# Patient Record
Sex: Female | Born: 1965 | Race: Black or African American | Hispanic: No | Marital: Single | Smoking: Current some day smoker
Health system: Southern US, Community
[De-identification: ages and names within clinical notes are randomized; demographics above are authoritative.]

## PROBLEM LIST (undated history)

## (undated) HISTORY — PX: ABDOMINAL HYSTERECTOMY: SHX81

---

## 1997-12-12 ENCOUNTER — Emergency Department (HOSPITAL_COMMUNITY): Admission: EM | Admit: 1997-12-12 | Discharge: 1997-12-12 | Payer: Self-pay

## 1997-12-26 ENCOUNTER — Encounter: Payer: Self-pay | Admitting: *Deleted

## 1997-12-26 ENCOUNTER — Inpatient Hospital Stay (HOSPITAL_COMMUNITY): Admission: AD | Admit: 1997-12-26 | Discharge: 1997-12-26 | Payer: Self-pay | Admitting: *Deleted

## 1999-03-18 ENCOUNTER — Encounter: Payer: Self-pay | Admitting: Emergency Medicine

## 1999-03-18 ENCOUNTER — Emergency Department (HOSPITAL_COMMUNITY): Admission: EM | Admit: 1999-03-18 | Discharge: 1999-03-18 | Payer: Self-pay | Admitting: Emergency Medicine

## 1999-04-14 ENCOUNTER — Inpatient Hospital Stay (HOSPITAL_COMMUNITY): Admission: RE | Admit: 1999-04-14 | Discharge: 1999-04-16 | Payer: Self-pay | Admitting: *Deleted

## 1999-04-14 ENCOUNTER — Encounter (INDEPENDENT_AMBULATORY_CARE_PROVIDER_SITE_OTHER): Payer: Self-pay | Admitting: Specialist

## 1999-04-17 ENCOUNTER — Inpatient Hospital Stay (HOSPITAL_COMMUNITY): Admission: AD | Admit: 1999-04-17 | Discharge: 1999-04-20 | Payer: Self-pay | Admitting: *Deleted

## 2001-08-13 ENCOUNTER — Emergency Department (HOSPITAL_COMMUNITY): Admission: EM | Admit: 2001-08-13 | Discharge: 2001-08-13 | Payer: Self-pay | Admitting: Emergency Medicine

## 2002-07-20 ENCOUNTER — Encounter: Payer: Self-pay | Admitting: Obstetrics

## 2002-07-20 ENCOUNTER — Ambulatory Visit (HOSPITAL_COMMUNITY): Admission: RE | Admit: 2002-07-20 | Discharge: 2002-07-20 | Payer: Self-pay | Admitting: Obstetrics

## 2003-03-22 ENCOUNTER — Emergency Department (HOSPITAL_COMMUNITY): Admission: EM | Admit: 2003-03-22 | Discharge: 2003-03-22 | Payer: Self-pay | Admitting: Family Medicine

## 2006-11-10 ENCOUNTER — Emergency Department (HOSPITAL_COMMUNITY): Admission: EM | Admit: 2006-11-10 | Discharge: 2006-11-10 | Payer: Self-pay | Admitting: Emergency Medicine

## 2007-03-13 ENCOUNTER — Emergency Department (HOSPITAL_COMMUNITY): Admission: EM | Admit: 2007-03-13 | Discharge: 2007-03-13 | Payer: Self-pay | Admitting: Emergency Medicine

## 2010-05-22 NOTE — Op Note (Signed)
[facility]  Patient:    Tabitha Winters, Tabitha Winters                      MRN: [id number] Proc. Date: 04/14/99 Adm. Date:  [id number] Attending:  Deniece Ree                           Operative Report  PREOPERATIVE DIAGNOSIS:       Leiomyomata uteri.  Severe dysmenorrhea and dyspareunia.  Chronic uterine pain.  POSTOPERATIVE DIAGNOSIS:      Leiomyomata uteri.  Severe dysmenorrhea and dyspareunia.  Chronic uterine pain. Plus pending pathology.  OPERATION:                    Total vaginal hysterectomy.  SURGEON:                      Deniece Ree, M.D.  ASSISTANT:                    Kathreen Cosier, M.D.  ANESTHESIA:                   General anesthesia.  ESTIMATED BLOOD LOSS:         100 cc.  CONDITION:                    The patient tolerated the procedure well and returned to the recovery room in satisfactory condition.  DESCRIPTION OF PROCEDURE:     The patient was taken to the operating room and prepped and draped in the usual fashion for a vaginal surgery.  The weighted speculum was placed in the vagina following which the anterior and posterior lips of the cervix were then grasped with Milus Banister.  The cervix was then circumscribed with the use of a knife.  The anterior and posterior vaginal mucosa was pushed forward following which the posterior cul-de-sac was then entered using Mayo scissors.  The weighted Bonanno speculum was then put into place.  The cardinal ligaments were then clamped and ligated using #1 chromic in Heaney stitches.  This was done in a sequential fashion until the uterine vessels were  identified, doubly clamped, and tied using #1 chromic in Heaney stitches.  This was done likewise bilaterally and proceeded in a sequential fashion until the uterus was removed from the vaginal cuff.  At this point, hemostasis was obtained and he sponge, needle, and instrument counts were correct x 2.  The  abdominal peritoneum was then closed in a pursestring type fashion using #1 chromic in a pursestring  stitch.  The vaginal cuff was then closed in a horizontal fashion first with angle stitches bilaterally followed by interrupted stitches utilizing #1 chromic.  A Foley was inserted into the bladder and clear urine was present.  At this point, hemostasis was present and all sponge, needle, and instrument counts were correct. The procedure was then terminated.  The patient tolerated the procedure well and returned to the recovery room in satisfactory condition. DD:  04/14/99 TD:  04/14/99 Job: [id number]

## 2010-05-22 NOTE — Discharge Summary (Signed)
[facility]  Patient:    Tabitha Winters, Tabitha Winters                      MRN: [id number] Adm. Date:  [id number] Disc. Date: [id number] Attending:  Deniece Ree                           Discharge Summary  BRIEF HISTORY:  The patient is a 45 year old female who was admitted for a total vaginal hysterectomy.  The patient had been complaining of severe dysmenorrhea,  dyspareunia, chronic uterine pain for some time.  She was also noted to have a uterus with numerous myomas and approximately 12-13 weeks in size.  The patient was admitted and underwent a total vaginal hysterectomy.  From this procedure she tolerated it very well without any problems.  She did good throughout her postoperative time and was discharged on the second post- operative day.  WOUND CARE:  The patient was instructed on the possible complications and care or all of this type of surgery.  FOLLOWUP:  She was told to return to my in 4 weeks for follow up evaluation or all prior to that time should any problems arise. DD:  05/02/99 TD:  05/06/99 Job: [id number]

## 2010-05-22 NOTE — Discharge Summary (Signed)
[facility]  Patient:    Tabitha Winters, Tabitha Winters                      MRN: [id number] Adm. Date:  [id number] Disc. Date: [id number] Attending:  Deniece Ree                           Discharge Summary  BRIEF HISTORY:  The patient is a 45 year old female who had just been discharged x 1 day from a total vaginal hysterectomy.  At the day of readmission by Dr. Kirtland Bouchard in my absence, the patient had a temperature of 101.0, and at which time the felt the possibility of a cuff cellulitis was present.  IV antibiotics was then  begun on my return.  On April 20, 1999, the patient had been afebrile, no problems and was discharged on that day on antibiotics.  She was instructed on the possible complications, care for this type of admission and surgery.  FOLLOWUP:  She was told to return to my office in 4 weeks for follow up evaluation or to call prior to that time should any problems arise. DD:  05/02/99 TD:  05/06/99 Job: [id number]

## 2010-05-22 NOTE — H&P (Signed)
[facility]  Patient:    Tabitha Winters, Tabitha Winters                      MRN: [id number] Adm. Date:  [id number] Attending:  Deniece Ree                         History and Physical  HISTORY:                      Patient is a 45 year old female who is being admitted for a total vaginal hysterectomy.  Patient came to me because of severe dysmenorrhea, severe dyspareunia, and chronic uterine pain.  She also had been old she has myomas present.  At the time of evaluation patient had a uterus that was approximately 12 weeks size with numerous myomas present and moderate to severe  tenderness on palpation.  Adnexa are benign.  The decision the proceed with a hysterectomy was done with the understanding of the patients of all the possible complications and care.  She understood.  All of her questions were answered.  PAST MEDICAL HISTORY:         Patient has had a bilateral tubal ligation in the  past.  She has also informed me of her religious reasons for not accepting any blood.  PHYSICAL EXAMINATION:  GENERAL:                      Well-developed, well-nourished female in no acute  distress.  HEENT:                        Within normal limits.  NECK:                         Supple.  BREASTS:                      Without masses, tenderness, or discharge.  LUNGS:                        Clear to percussion and auscultation.  HEART:                        Normal sinus rhythm without murmur, rub, or gallop.  ABDOMEN:                      Benign except for the mid lower quadrant which is  moderate tenderness on palpation.  EXTREMITIES:                  Within normal limits.  NEUROLOGIC:                   Within normal limits.  PELVIC:                       External genitalia, BUSB within normal limits. Vagina is clear.  Cervix is firm and nontender.  The uterus is approximately 12  weeks in size with several myomas present.  Moderate tenderness to  palpation.  DIAGNOSES:                    1. Leiomyomata uteri.  2. Severe dysmenorrhea, dyspareunia.                               3. Chronic uterine pain.  PLAN:                         Total vaginal hysterectomy. DD:  04/14/99 TD:  04/14/99 Job: [id number]

## 2010-06-17 ENCOUNTER — Other Ambulatory Visit (HOSPITAL_COMMUNITY): Payer: Self-pay | Admitting: Obstetrics

## 2010-06-17 DIAGNOSIS — Z1231 Encounter for screening mammogram for malignant neoplasm of breast: Secondary | ICD-10-CM

## 2010-06-30 ENCOUNTER — Ambulatory Visit (HOSPITAL_COMMUNITY): Payer: Self-pay

## 2010-07-13 ENCOUNTER — Ambulatory Visit (HOSPITAL_COMMUNITY): Admission: RE | Admit: 2010-07-13 | Payer: Self-pay | Source: Ambulatory Visit

## 2010-07-21 ENCOUNTER — Ambulatory Visit (HOSPITAL_COMMUNITY)
Admission: RE | Admit: 2010-07-21 | Discharge: 2010-07-21 | Disposition: A | Discharge status: Home / Self Care | Payer: Self-pay | Source: Ambulatory Visit | Attending: Obstetrics | Admitting: Obstetrics

## 2010-07-21 DIAGNOSIS — Z1231 Encounter for screening mammogram for malignant neoplasm of breast: Secondary | ICD-10-CM

## 2010-07-24 ENCOUNTER — Other Ambulatory Visit: Payer: Self-pay | Admitting: Obstetrics

## 2010-07-24 DIAGNOSIS — R928 Other abnormal and inconclusive findings on diagnostic imaging of breast: Secondary | ICD-10-CM

## 2010-07-30 ENCOUNTER — Ambulatory Visit
Admission: RE | Admit: 2010-07-30 | Discharge: 2010-07-30 | Disposition: A | Discharge status: Home / Self Care | Payer: Self-pay | Source: Ambulatory Visit | Attending: Obstetrics | Admitting: Obstetrics

## 2010-07-30 DIAGNOSIS — R928 Other abnormal and inconclusive findings on diagnostic imaging of breast: Secondary | ICD-10-CM

## 2011-08-31 ENCOUNTER — Other Ambulatory Visit (HOSPITAL_COMMUNITY): Payer: Self-pay | Admitting: Obstetrics

## 2011-08-31 DIAGNOSIS — Z1231 Encounter for screening mammogram for malignant neoplasm of breast: Secondary | ICD-10-CM

## 2011-09-20 ENCOUNTER — Ambulatory Visit (HOSPITAL_COMMUNITY): Payer: Self-pay

## 2011-09-24 ENCOUNTER — Emergency Department (HOSPITAL_COMMUNITY): Payer: 59

## 2011-09-24 ENCOUNTER — Emergency Department (HOSPITAL_COMMUNITY)
Admission: EM | Admit: 2011-09-24 | Discharge: 2011-09-24 | Disposition: A | Discharge status: Home / Self Care | Payer: 59 | Attending: Emergency Medicine | Admitting: Emergency Medicine

## 2011-09-24 ENCOUNTER — Encounter (HOSPITAL_COMMUNITY): Payer: Self-pay

## 2011-09-24 DIAGNOSIS — J069 Acute upper respiratory infection, unspecified: Secondary | ICD-10-CM | POA: Insufficient documentation

## 2011-09-24 DIAGNOSIS — Z79899 Other long term (current) drug therapy: Secondary | ICD-10-CM | POA: Insufficient documentation

## 2011-09-24 LAB — CBC
HCT: 34.1 % — ABNORMAL LOW (ref 36.0–46.0)
Hemoglobin: 11.3 g/dL — ABNORMAL LOW (ref 12.0–15.0)
MCH: 27.3 pg (ref 26.0–34.0)
MCHC: 33.1 g/dL (ref 30.0–36.0)
MCV: 82.4 fL (ref 78.0–100.0)

## 2011-09-24 LAB — BASIC METABOLIC PANEL
BUN: 9 mg/dL (ref 6–23)
Calcium: 9.3 mg/dL (ref 8.4–10.5)
Creatinine, Ser: 0.89 mg/dL (ref 0.50–1.10)
GFR calc non Af Amer: 78 mL/min — ABNORMAL LOW (ref >90)
Glucose, Bld: 99 mg/dL (ref 70–99)

## 2011-09-24 LAB — URINALYSIS, ROUTINE W REFLEX MICROSCOPIC
Bilirubin Urine: NEGATIVE
Protein, ur: NEGATIVE mg/dL
Urobilinogen, UA: 0.2 mg/dL (ref 0.0–1.0)

## 2011-09-24 LAB — URINE MICROSCOPIC-ADD ON

## 2011-09-24 MED ORDER — HYDROCODONE-HOMATROPINE 5-1.5 MG/5ML PO SYRP: 5.0000 mL | ORAL_SOLUTION | Freq: Four times a day (QID) | ORAL | Status: DC | PRN

## 2011-09-24 NOTE — ED Provider Notes (Signed)
History     CSN: [id number]  Arrival date & time 09/24/11  1556   First MD Initiated Contact with Patient 09/24/11 1638      No chief complaint on file.   (Consider location/radiation/quality/duration/timing/severity/associated sxs/prior treatment) HPI Comments: 46 year old female with no significant past medical history presents emergency department with chief complaint of upper respiratory type symptoms.  Onset of symptoms began yesterday and and include productive cough, nasal congestion, sinus pressure, headache, and dizziness.  Patient reports that she has been taking over-the-counter Mucinex and Tussin cough DM with good relief.  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  Patient denies actually having a syncopal episode, chest pain, shortness of breath, change in vision, ataxia, or disequilibrium.  She also developed fever, night sweats, or chills.  Patient has no other complaints this time.  The history is provided by the patient.    History reviewed. No pertinent past medical history.  Past Surgical History  Procedure Date  . Colon surgery     No family history on file.  History  Substance Use Topics  . Smoking status: Not on file  . Smokeless tobacco: Not on file  . Alcohol Use:      social    OB History    Grav Para Term Preterm Abortions TAB SAB Ect Mult Living                  Review of Systems  Constitutional: Negative for fever, chills, appetite change and fatigue.  HENT: Positive for congestion and rhinorrhea. Negative for hearing loss, ear pain, nosebleeds, sore throat, sneezing, trouble swallowing, neck stiffness, voice change, postnasal drip, sinus pressure, tinnitus and ear discharge.   Eyes: Negative for photophobia and visual disturbance.  Respiratory: Positive for cough and chest tightness.  Negative for apnea, choking, shortness of breath, wheezing and stridor.   Cardiovascular: Negative for chest pain, palpitations and leg swelling.  Gastrointestinal: Negative for nausea, vomiting, abdominal pain, diarrhea and constipation.  Genitourinary: Negative for dysuria, urgency and flank pain.  Musculoskeletal: Negative for myalgias.  Neurological: Negative for dizziness, seizures, syncope, weakness, light-headedness, numbness and headaches.  Psychiatric/Behavioral: Negative for behavioral problems and confusion.  All other systems reviewed and are negative.    Allergies  Review of patient's allergies indicates no known allergies.  Home Medications   Current Outpatient Rx  Name Route Sig Dispense Refill  . ALBUTEROL SULFATE HFA 108 (90 BASE) MCG/ACT IN AERS Inhalation Inhale 2 puffs into the lungs every 6 (six) hours as needed. For cough/wheezing    . DEXTROMETHORPHAN-GUAIFENESIN 10-100 MG/5ML PO SYRP Oral Take 15 mLs by mouth every 12 (twelve) hours. For cough    . FLUTICASONE PROPIONATE 50 MCG/ACT NA SUSP Nasal Place 2 sprays into the nose daily.    . GUAIFENESIN ER 600 MG PO TB12 Oral Take 1,200 mg by mouth 2 (two) times daily.    Marland Kitchen LEVOCETIRIZINE DIHYDROCHLORIDE 5 MG PO TABS Oral Take 5 mg by mouth every evening.    Marland Kitchen MONTELUKAST SODIUM 10 MG PO TABS Oral Take 10 mg by mouth at bedtime.    . OLOPATADINE HCL 0.2 % OP SOLN Ophthalmic Apply 1 drop to eye daily as needed. For allergies      BP 116/79  Pulse 70  Temp 98.3 F (36.8 C) (Oral)  Resp 18  SpO2 100%  Physical  Exam  Constitutional: She is oriented to person, place, and time. She appears well-developed and well-nourished. No distress.  HENT:  Head: Normocephalic and atraumatic.  Right Ear: External ear normal.  Left Ear: External ear normal.  Nose: Rhinorrhea present. No nasal septal hematoma. No epistaxis.  Mouth/Throat: Uvula is midline, oropharynx is clear and moist and mucous membranes are normal. No  oropharyngeal exudate.  Eyes: Conjunctivae normal and EOM are normal. Pupils are equal, round, and reactive to light. No scleral icterus.  Neck: Normal range of motion. Neck supple. No tracheal deviation present. No thyromegaly present.  Cardiovascular: Normal rate, regular rhythm, normal heart sounds and intact distal pulses.   Pulmonary/Chest: Effort normal and breath sounds normal. No stridor. No respiratory distress. She has no wheezes.  Abdominal: Soft.  Musculoskeletal: Normal range of motion. She exhibits no edema and no tenderness.  Neurological: She is alert and oriented to person, place, and time. Coordination normal.  Skin: Skin is warm and dry. No rash noted. She is not diaphoretic. No erythema. No pallor.  Psychiatric: She has a normal mood and affect. Her behavior is normal.    ED Course  Procedures (including critical care time)  Labs Reviewed  CBC - Abnormal; Notable for the following:    WBC 12.0 (*)     Hemoglobin 11.3 (*)     HCT 34.1 (*)     All other components within normal limits  BASIC METABOLIC PANEL - Abnormal; Notable for the following:    Sodium 134 (*)     GFR calc non Af Amer 78 (*)     All other components within normal limits  GLUCOSE, CAPILLARY - Abnormal; Notable for the following:    Glucose-Capillary 107 (*)     All other components within normal limits  URINALYSIS, ROUTINE W REFLEX MICROSCOPIC   Dg Chest 2 View  09/24/2011  *RADIOLOGY REPORT*  Clinical Data: Dizziness.  Chest congestion.  Cough.  Smoker.  CHEST - 2 VIEW  Comparison: Report from 03/18/1999  Findings: Cardiac and mediastinal contours appear normal.  The lungs appear clear.  No pleural effusion is identified.  IMPRESSION:  No significant abnormality identified.   Original Report Authenticated By: Dellia Cloud, M.D.      No diagnosis found.    MDM  URI  Pt CXR negative for acute infiltrate. Patients symptoms are consistent with URI, likely viral etiology. Discussed  that antibiotics are not indicated for viral infections. Pt will be discharged with symptomatic treatment.  Verbalizes understanding and is agreeable with plan. Pt is hemodynamically stable & in NAD prior to dc.         Jaci Carrel, [place] 09/24/11 [phone]

## 2011-09-24 NOTE — ED Notes (Signed)
Patient reports the following: Thurs felt sick felt like chest was going to explored when coughing. Treated self with Mucomyst tablets and tessalon from hospital pharmacy. Noted improvement with coughing now productive from dark green-ow light yellow. This am woke up with a headache, described as a sinus headache/pressure and point specific to left side of forehead rated pain 6-7/10. 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

## 2011-09-24 NOTE — ED Provider Notes (Signed)
Medical screening examination/treatment/procedure(s) were performed by non-physician practitioner and as supervising physician I was immediately available for consultation/collaboration.  Brinden Kincheloe, MD 09/24/11 2326 

## 2011-10-06 ENCOUNTER — Ambulatory Visit (HOSPITAL_COMMUNITY): Payer: 59 | Attending: Obstetrics

## 2011-12-13 ENCOUNTER — Emergency Department (HOSPITAL_COMMUNITY)
Admission: EM | Admit: 2011-12-13 | Discharge: 2011-12-14 | Disposition: A | Discharge status: Home / Self Care | Payer: 59 | Attending: Emergency Medicine | Admitting: Emergency Medicine

## 2011-12-13 ENCOUNTER — Encounter (HOSPITAL_COMMUNITY): Payer: Self-pay | Admitting: *Deleted

## 2011-12-13 DIAGNOSIS — Z90710 Acquired absence of both cervix and uterus: Secondary | ICD-10-CM | POA: Insufficient documentation

## 2011-12-13 DIAGNOSIS — Z79899 Other long term (current) drug therapy: Secondary | ICD-10-CM | POA: Insufficient documentation

## 2011-12-13 DIAGNOSIS — R112 Nausea with vomiting, unspecified: Secondary | ICD-10-CM | POA: Insufficient documentation

## 2011-12-13 DIAGNOSIS — R197 Diarrhea, unspecified: Secondary | ICD-10-CM | POA: Insufficient documentation

## 2011-12-13 DIAGNOSIS — A084 Viral intestinal infection, unspecified: Secondary | ICD-10-CM

## 2011-12-13 DIAGNOSIS — Z9889 Other specified postprocedural states: Secondary | ICD-10-CM | POA: Insufficient documentation

## 2011-12-13 DIAGNOSIS — A088 Other specified intestinal infections: Secondary | ICD-10-CM | POA: Insufficient documentation

## 2011-12-13 DIAGNOSIS — F17200 Nicotine dependence, unspecified, uncomplicated: Secondary | ICD-10-CM | POA: Insufficient documentation

## 2011-12-13 NOTE — ED Notes (Signed)
Pt c/o cold symptoms starting Sunday; took flu shot today and two hours later developed body aches; cramping; diarrhea;

## 2011-12-14 LAB — URINALYSIS, ROUTINE W REFLEX MICROSCOPIC
Ketones, ur: NEGATIVE mg/dL
Nitrite: NEGATIVE
Specific Gravity, Urine: 1.037 — ABNORMAL HIGH (ref 1.005–1.030)
Urobilinogen, UA: 0.2 mg/dL (ref 0.0–1.0)
pH: 5.5 (ref 5.0–8.0)

## 2011-12-14 LAB — URINE MICROSCOPIC-ADD ON

## 2011-12-14 MED ORDER — SODIUM CHLORIDE 0.9 % IV BOLUS (SEPSIS)
1000.0000 mL | Freq: Once | INTRAVENOUS | Status: AC
  Administered 2011-12-14: 1000 mL via INTRAVENOUS

## 2011-12-14 MED ORDER — MORPHINE SULFATE 4 MG/ML IJ SOLN
4.0000 mg | Freq: Once | INTRAMUSCULAR | Status: AC
  Administered 2011-12-14: 4 mg via INTRAVENOUS
  Filled 2011-12-14: qty 1

## 2011-12-14 MED ORDER — PROMETHAZINE HCL 25 MG/ML IJ SOLN
25.0000 mg | Freq: Once | INTRAMUSCULAR | Status: AC
  Administered 2011-12-14: 25 mg via INTRAVENOUS
  Filled 2011-12-14: qty 1

## 2011-12-14 MED ORDER — ONDANSETRON HCL 4 MG/2ML IJ SOLN
4.0000 mg | Freq: Once | INTRAMUSCULAR | Status: AC
  Administered 2011-12-14: 4 mg via INTRAVENOUS
  Filled 2011-12-14: qty 2

## 2011-12-14 MED ORDER — ONDANSETRON HCL 8 MG PO TABS: 8.0000 mg | ORAL_TABLET | Freq: Three times a day (TID) | ORAL | Status: DC | PRN

## 2011-12-14 MED ORDER — ACETAMINOPHEN 325 MG PO TABS
650.0000 mg | ORAL_TABLET | Freq: Once | ORAL | Status: AC
  Administered 2011-12-14: 650 mg via ORAL
  Filled 2011-12-14: qty 2

## 2011-12-14 MED ORDER — HYDROCODONE-ACETAMINOPHEN 5-325 MG PO TABS: 1.0000 | ORAL_TABLET | ORAL | Status: DC | PRN

## 2011-12-14 NOTE — ED Provider Notes (Signed)
History     CSN: [id number]  Arrival date & time 12/13/11  2225   First MD Initiated Contact with Patient 12/14/11 0236      Chief Complaint  Patient presents with  . Generalized Body Aches  . Diarrhea    (Consider location/radiation/quality/duration/timing/severity/associated sxs/prior treatment) HPI History provided by pt.   Pt had body aches and chills 2 days ago.  Yesterday at approx 2pm, she developed lower abdominal cramping, nausea w/ single episode of vomiting, and 10+ episodes of diarrhea.  Unable to tolerate pos.  No known fever and denies hematemesis/hematochezia/melena, urinary and vaginal sx.  Son w/ similar sx 2 days ago.  No recent travel or abx.  Past abd surgeries include hysterectomy.  History reviewed. No pertinent past medical history.  Past Surgical History  Procedure Date  . Colon surgery   . Abdominal hysterectomy     No family history on file.  History  Substance Use Topics  . Smoking status: Current Every Day Smoker -- 0.5 packs/day for 15 years    Types: Cigarettes  . Smokeless tobacco: Not on file  . Alcohol Use: No     Comment: social    OB History    Grav Para Term Preterm Abortions TAB SAB Ect Mult Living                  Review of Systems  All other systems reviewed and are negative.    Allergies  Review of patient's allergies indicates no known allergies.  Home Medications   Current Outpatient Rx  Name  Route  Sig  Dispense  Refill  . LEVOCETIRIZINE DIHYDROCHLORIDE 5 MG PO TABS   Oral   Take 5 mg by mouth every evening.         . OLOPATADINE HCL 0.2 % OP SOLN   Both Eyes   Place 1 drop into both eyes daily as needed. For allergies         . ALBUTEROL SULFATE HFA 108 (90 BASE) MCG/ACT IN AERS   Inhalation   Inhale 2 puffs into the lungs every 6 (six) hours as needed. For cough/wheezing         . FLUTICASONE PROPIONATE 50 MCG/ACT NA SUSP   Nasal   Place 2 sprays into the nose daily as needed. For nasal drainage         . MONTELUKAST SODIUM 10 MG PO TABS   Oral   Take 10 mg by mouth at bedtime.           BP 117/74  Pulse 114  Temp 99.5 F (37.5 C)  Resp 20  SpO2 100%  Physical Exam  Nursing note and vitals reviewed. Constitutional: She is oriented to person, place, and time. She appears well-developed and well-nourished. No distress.  HENT:  Head: Normocephalic and atraumatic.  Mouth/Throat: Oropharynx is clear and moist.  Eyes:       Normal appearance  Neck: Normal range of motion.  Cardiovascular: Normal rate and regular rhythm.        HR 96bmp  Pulmonary/Chest: Effort normal and breath sounds normal. No respiratory distress.  Abdominal: Soft. Bowel sounds are normal. She exhibits no distension and no mass. There is no rebound and no guarding.       Diffuse ttp  Genitourinary:       No CVA tenderness  Musculoskeletal: Normal range of motion.  Neurological: She is alert and oriented to person, place, and time.  Skin: Skin is warm and dry. No  rash noted.  Psychiatric: She has a normal mood and affect. Her behavior is normal.    ED Course  Procedures (including critical care time)  Labs Reviewed  URINALYSIS, ROUTINE W REFLEX MICROSCOPIC - Abnormal; Notable for the following:    APPearance CLOUDY (*)     Specific Gravity, Urine 1.037 (*)     Hgb urine dipstick MODERATE (*)     Bilirubin Urine SMALL (*)     Leukocytes, UA SMALL (*)     All other components within normal limits  URINE MICROSCOPIC-ADD ON   No results found.   1. Viral gastroenteritis       MDM  46yo healthy F presents w/ abdominal cramping, N/V/D x 12hrs.  Afebrile, tachycardic, abd diffusely tender on exam.  Likely has gastroenteritis w/ mild dehydration.  U/A pending.  Will treat w/ 1L IV NS bolus, zofran and morphine and reassess shortly.  3:24 AM   U/A neg.  Temp increased to 100.4 but HR improved.  Pt to receive tylenol.  Cramping resolved but patient continues to be nauseous despite receiving  both zofran and phenergan.  Suspect that it may be related to lack of food/sleep.  Will po challenge.  5:40 AM   Pt tolerating pos and reports feeling better.  Temp improved and VS otherwise stable.  D/c'd home.  Return precautions discussed. 6:11 AM         Otilio Miu, PA-C 12/14/11 [phone]

## 2011-12-14 NOTE — ED Provider Notes (Signed)
Medical screening examination/treatment/procedure(s) were performed by non-physician practitioner and as supervising physician I was immediately available for consultation/collaboration.   Shameek Nyquist M Alonnah Lampkins, MD 12/14/11 0823 

## 2012-02-18 ENCOUNTER — Emergency Department (HOSPITAL_COMMUNITY)
Admission: EM | Admit: 2012-02-18 | Discharge: 2012-02-18 | Disposition: A | Discharge status: Home / Self Care | Payer: 59 | Attending: Emergency Medicine | Admitting: Emergency Medicine

## 2012-02-18 ENCOUNTER — Emergency Department (HOSPITAL_COMMUNITY): Payer: 59

## 2012-02-18 ENCOUNTER — Encounter (HOSPITAL_COMMUNITY): Payer: Self-pay | Admitting: Emergency Medicine

## 2012-02-18 DIAGNOSIS — Z79899 Other long term (current) drug therapy: Secondary | ICD-10-CM | POA: Insufficient documentation

## 2012-02-18 DIAGNOSIS — Y9289 Other specified places as the place of occurrence of the external cause: Secondary | ICD-10-CM | POA: Insufficient documentation

## 2012-02-18 DIAGNOSIS — F17200 Nicotine dependence, unspecified, uncomplicated: Secondary | ICD-10-CM | POA: Insufficient documentation

## 2012-02-18 DIAGNOSIS — Y9389 Activity, other specified: Secondary | ICD-10-CM | POA: Insufficient documentation

## 2012-02-18 DIAGNOSIS — IMO0002 Reserved for concepts with insufficient information to code with codable children: Secondary | ICD-10-CM | POA: Insufficient documentation

## 2012-02-18 DIAGNOSIS — S46912A Strain of unspecified muscle, fascia and tendon at shoulder and upper arm level, left arm, initial encounter: Secondary | ICD-10-CM

## 2012-02-18 DIAGNOSIS — W108XXA Fall (on) (from) other stairs and steps, initial encounter: Secondary | ICD-10-CM | POA: Insufficient documentation

## 2012-02-18 DIAGNOSIS — S139XXA Sprain of joints and ligaments of unspecified parts of neck, initial encounter: Secondary | ICD-10-CM | POA: Insufficient documentation

## 2012-02-18 DIAGNOSIS — R209 Unspecified disturbances of skin sensation: Secondary | ICD-10-CM | POA: Insufficient documentation

## 2012-02-18 DIAGNOSIS — S161XXA Strain of muscle, fascia and tendon at neck level, initial encounter: Secondary | ICD-10-CM

## 2012-02-18 MED ORDER — HYDROCODONE-ACETAMINOPHEN 5-325 MG PO TABS: 1.0000 | ORAL_TABLET | ORAL | Status: DC | PRN

## 2012-02-18 MED ORDER — METHOCARBAMOL 500 MG PO TABS: 500.0000 mg | ORAL_TABLET | Freq: Two times a day (BID) | ORAL | Status: DC

## 2012-02-18 MED ORDER — IBUPROFEN 200 MG PO TABS
600.0000 mg | ORAL_TABLET | Freq: Once | ORAL | Status: AC
  Administered 2012-02-18: 600 mg via ORAL
  Filled 2012-02-18: qty 3

## 2012-02-18 MED ORDER — METHOCARBAMOL 500 MG PO TABS
500.0000 mg | ORAL_TABLET | Freq: Once | ORAL | Status: AC
  Administered 2012-02-18: 500 mg via ORAL
  Filled 2012-02-18: qty 1

## 2012-02-18 MED ORDER — IBUPROFEN 600 MG PO TABS: 600.0000 mg | ORAL_TABLET | Freq: Four times a day (QID) | ORAL | Status: DC | PRN

## 2012-02-18 MED ORDER — HYDROCODONE-ACETAMINOPHEN 5-325 MG PO TABS
1.0000 | ORAL_TABLET | Freq: Once | ORAL | Status: AC
  Administered 2012-02-18: 1 via ORAL
  Filled 2012-02-18: qty 1

## 2012-02-18 NOTE — Progress Notes (Signed)
Pt confirmed during 02/18/12 [facility] visit pcp is bernard marshall epic updated

## 2012-02-18 NOTE — ED Notes (Signed)
Per patient, right shoulder injury r/t fall

## 2012-02-18 NOTE — ED Provider Notes (Signed)
History  This chart was scribed for Tabitha Racer, MD by Bennett Scrape, ED Scribe. This patient was seen in room WTR7/WTR7 and the patient's care was started at 10:42 AM.  CSN: [id number]  Arrival date & time 02/18/12  4098   First MD Initiated Contact with Patient 02/18/12 1042      Chief Complaint  Patient presents with  . Shoulder Pain    Patient is a 47 y.o. female presenting with shoulder pain. The history is provided by the patient. No language interpreter was used.  Shoulder Pain This is a new problem. The current episode started less than 1 hour ago. The problem occurs constantly. The problem has not changed since onset.Pertinent negatives include no chest pain, no abdominal pain and no headaches. The symptoms are relieved by rest. She has tried nothing for the symptoms.    ,Tabitha ROCHEL is a 47 y.o. female who presents to the Emergency Department complaining of sudden onset, non-changing, constant right shoulder pain with associated numbness to the palmar aspect of the right 2nd finger and thumb and intermittent weakness in grip strength after a slip and fall down 5 steps that occurred 30 minutes PTA. Pt states that she fell backwards landing on her back and slipped down the rest of the steps. She reports that the pain is worse with ROM of the right shoulder. She denies head trauma or LOC as associated symptoms. She does not have a h/o chronic medical conditions and is a current everyday smoker and occasional alcohol user.  Pt is right handed.  History reviewed. No pertinent past medical history.  Past Surgical History  Procedure Laterality Date  . Colon surgery    . Abdominal hysterectomy      No family history on file.  History  Substance Use Topics  . Smoking status: Current Every Day Smoker -- 0.50 packs/day for 15 years    Types: Cigarettes  . Smokeless tobacco: Not on file  . Alcohol Use: No     Comment: social    No OB history provided.  Review of  Systems  Cardiovascular: Negative for chest pain.  Gastrointestinal: Negative for abdominal pain.  Musculoskeletal:       Positive for right shoulder pain  Neurological: Negative for headaches.  All other systems reviewed and are negative.    Allergies  Review of patient's allergies indicates no known allergies.  Home Medications   Current Outpatient Rx  Name  Route  Sig  Dispense  Refill  . levocetirizine (XYZAL) 5 MG tablet   Oral   Take 5 mg by mouth every evening.         . montelukast (SINGULAIR) 10 MG tablet   Oral   Take 10 mg by mouth at bedtime.         . Olopatadine HCl (PATADAY OP)   Ophthalmic   Apply 1 drop to eye daily.         Marland Kitchen HYDROcodone-acetaminophen (NORCO/VICODIN) 5-325 MG per tablet   Oral   Take 1 tablet by mouth every 4 (four) hours as needed for pain.   15 tablet   0   . ibuprofen (ADVIL,MOTRIN) 600 MG tablet   Oral   Take 1 tablet (600 mg total) by mouth every 6 (six) hours as needed for pain.   30 tablet   0   . methocarbamol (ROBAXIN) 500 MG tablet   Oral   Take 1 tablet (500 mg total) by mouth 2 (two) times daily.   20  tablet   0     Triage Vitals: BP 129/93  Pulse 72  Temp(Src) 98.6 F (37 C) (Oral)  Resp 18  SpO2 100%  Physical Exam  Nursing note and vitals reviewed. Constitutional: She is oriented to person, place, and time. She appears well-developed and well-nourished. No distress.  HENT:  Head: Normocephalic and atraumatic.  Mouth/Throat: Oropharynx is clear and moist.  No obvious sign of head trauma  Eyes: Conjunctivae and EOM are normal. Pupils are equal, round, and reactive to light.  Neck: Neck supple. No tracheal deviation present.  Right paraspinal cervical tenderness, right trapezius tenderness, right deltoid tenderness, no cervical midline tenderness, no obvious signs of trauma   Cardiovascular: Normal rate.   Pulmonary/Chest: Effort normal. No respiratory distress. She exhibits no tenderness (no  tenderness to the right clavicle).  Musculoskeletal: Normal range of motion.  neurovascularly intact, no right scapula tenderness, no deformities noted, no thoracic or lumbar tenderness  Neurological: She is alert and oriented to person, place, and time.  Equal grip strengths, 5/5 strength in lower extremities   Skin: Skin is warm and dry.  Psychiatric: She has a normal mood and affect. Her behavior is normal.    ED Course  Procedures (including critical care  DIAGNOSTIC STUDIES: Oxygen Saturation is 100% on room air, normal by my interpretation.    COORDINATION OF CARE: 10:52 AM- Advised pt of radiology results. Discussed discharge plan which includes work note for two days and CT scan of neck with pt and pt agreed to plan. Also advised pt to follow up with orthopedist and pt agreed.  12:02 PM-Pt rechecked and c-spine was medically cleared. Upon re-exam, pt has tenderness to palpation of the right paraspinal cervical muscles and right trapezius muscle. Discussed discharge plan which includes f/u with orthopedist and pt agreed to this plan.  Labs Reviewed - No data to display Dg Shoulder Right  02/18/2012  *RADIOLOGY REPORT*  Clinical Data: Larey Seat today with pain  RIGHT SHOULDER - 2+ VIEW  Comparison: None.  Findings: The right humeral head is in normal position and the right glenohumeral joint space appears normal.  The right AC joint is normally aligned.  No acute bony abnormality is seen.  IMPRESSION: Negative.   Original Report Authenticated By: Dwyane Dee, M.D.    Ct Cervical Spine Wo Contrast  02/18/2012  *RADIOLOGY REPORT*  Clinical Data: Acute onset right shoulder pain with numbness in the thumb and second finger.  Status post fall.  CT CERVICAL SPINE WITHOUT CONTRAST  Technique:  Multidetector CT imaging of the cervical spine was performed. Multiplanar CT image reconstructions were also generated.  Comparison: None.  Findings: There is no cervical spine fracture or subluxation.  Mild  disc bulging and endplate spurring are noted at C5-6 and C6-7.  The central canal appears open.  Facet joints are unremarkable. Paraspinous soft tissue structures appear normal.  Lung apices are clear.  IMPRESSION: No acute finding.  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   Original Report Authenticated By: Holley Dexter, M.D.      1. Cervical strain   2. Left shoulder strain       MDM  I personally performed the services described in this documentation, which was scribed in my presence. The recorded information has been reviewed and is accurate.    Tabitha Racer, MD 02/18/12 [phone]

## 2012-06-06 ENCOUNTER — Encounter (HOSPITAL_COMMUNITY): Payer: Self-pay | Admitting: Emergency Medicine

## 2012-06-06 ENCOUNTER — Emergency Department (HOSPITAL_COMMUNITY): Payer: 59

## 2012-06-06 ENCOUNTER — Emergency Department (HOSPITAL_COMMUNITY)
Admission: EM | Admit: 2012-06-06 | Discharge: 2012-06-06 | Disposition: A | Discharge status: Home / Self Care | Payer: 59 | Attending: Emergency Medicine | Admitting: Emergency Medicine

## 2012-06-06 DIAGNOSIS — F17200 Nicotine dependence, unspecified, uncomplicated: Secondary | ICD-10-CM | POA: Insufficient documentation

## 2012-06-06 DIAGNOSIS — Y92009 Unspecified place in unspecified non-institutional (private) residence as the place of occurrence of the external cause: Secondary | ICD-10-CM | POA: Insufficient documentation

## 2012-06-06 DIAGNOSIS — Y9389 Activity, other specified: Secondary | ICD-10-CM | POA: Insufficient documentation

## 2012-06-06 DIAGNOSIS — S60229A Contusion of unspecified hand, initial encounter: Secondary | ICD-10-CM | POA: Insufficient documentation

## 2012-06-06 DIAGNOSIS — W2209XA Striking against other stationary object, initial encounter: Secondary | ICD-10-CM | POA: Insufficient documentation

## 2012-06-06 DIAGNOSIS — S60221A Contusion of right hand, initial encounter: Secondary | ICD-10-CM

## 2012-06-06 DIAGNOSIS — Z79899 Other long term (current) drug therapy: Secondary | ICD-10-CM | POA: Insufficient documentation

## 2012-06-06 MED ORDER — IBUPROFEN 800 MG PO TABS
800.0000 mg | ORAL_TABLET | Freq: Once | ORAL | Status: AC
  Administered 2012-06-06: 800 mg via ORAL
  Filled 2012-06-06 (×2): qty 1

## 2012-06-06 MED ORDER — IBUPROFEN 800 MG PO TABS: 800.0000 mg | ORAL_TABLET | Freq: Three times a day (TID) | ORAL | Status: DC | PRN

## 2012-06-06 NOTE — ED Provider Notes (Signed)
History     CSN: [id number]  Arrival date & time 06/06/12  1229   First MD Initiated Contact with Patient 06/06/12 1312      No chief complaint on file.   (Consider location/radiation/quality/duration/timing/severity/associated sxs/prior treatment) HPI Comments: Patient reports she hit her right hand on the door frame as she was grabbing her puppy who was trying to run out of the door.  Reports throbbing pain and swelling to her right hand, indicates area over 3rd MCP.  Has been putting ice on it with some improvement.  Denies weakness or numbness of hand, denies wrist pain.    The history is provided by the patient.    History reviewed. No pertinent past medical history.  Past Surgical History  Procedure Laterality Date  . Colon surgery    . Abdominal hysterectomy      No family history on file.  History  Substance Use Topics  . Smoking status: Current Every Day Smoker -- 0.50 packs/day for 15 years    Types: Cigarettes  . Smokeless tobacco: Not on file  . Alcohol Use: No     Comment: social    OB History   Grav Para Term Preterm Abortions TAB SAB Ect Mult Living                  Review of Systems  Skin: Negative for color change and wound.  Neurological: Negative for weakness and numbness.    Allergies  Review of patient's allergies indicates no known allergies.  Home Medications   Current Outpatient Rx  Name  Route  Sig  Dispense  Refill  . loratadine (CLARITIN) 10 MG tablet   Oral   Take 10 mg by mouth daily.         . montelukast (SINGULAIR) 10 MG tablet   Oral   Take 10 mg by mouth at bedtime.         . Olopatadine HCl (PATADAY OP)   Ophthalmic   Apply 1 drop to eye daily.           BP 127/76  Pulse 81  Temp(Src) 99.1 F (37.3 C) (Oral)  Resp 16  SpO2 100%  Physical Exam  Nursing note and vitals reviewed. Constitutional: She appears well-developed and well-nourished. No distress.  HENT:  Head: Normocephalic and atraumatic.    Neck: Neck supple.  Pulmonary/Chest: Effort normal.  Musculoskeletal: Normal range of motion. She exhibits edema and tenderness.       Hands: Full AROM of all digits of right hand and right wrist.  Pt is slow to move fingers secondary to pain.  Capillary refill < 2 seconds, sensation intact.   Neurological: She is alert.  Skin: She is not diaphoretic.    ED Course  Procedures (including critical care time)  Labs Reviewed - No data to display Dg Hand Complete Right  06/06/2012   *RADIOLOGY REPORT*  Clinical Data: Injury, pain  RIGHT HAND - COMPLETE 3+ VIEW  Comparison: None.  Findings: Three views of the right hand submitted.  No acute fracture or subluxation.  No radiopaque foreign body.  IMPRESSION: No acute fracture or subluxation.   Original Report Authenticated By: Natasha Mead, M.D.    1. Hand contusion, right, initial encounter     MDM  Pt with contusion of right hand.  No fracture on xray.  Full AROM, neurovascularly intact.  Pt placed in ACE bandage, RICE at home with NSAIDs.  Discussed all results with patient.  Pt given return precautions.  Pt verbalizes understanding and agrees with plan.           Trixie Dredge, PA-C 06/06/12 1436

## 2012-06-06 NOTE — ED Notes (Signed)
Pt trying to grab her dog from going out the door without leash and hit right hand on the door and pt has swelling to right middle knuckle.

## 2012-06-07 NOTE — ED Provider Notes (Signed)
Medical screening examination/treatment/procedure(s) were performed by non-physician practitioner and as supervising physician I was immediately available for consultation/collaboration.  Christopher J. Pollina, MD 06/07/12 0711 

## 2012-10-25 ENCOUNTER — Ambulatory Visit (HOSPITAL_COMMUNITY)
Admission: RE | Admit: 2012-10-25 | Discharge: 2012-10-25 | Disposition: A | Discharge status: Home / Self Care | Payer: 59 | Source: Ambulatory Visit | Attending: Obstetrics | Admitting: Obstetrics

## 2012-10-25 DIAGNOSIS — Z1231 Encounter for screening mammogram for malignant neoplasm of breast: Secondary | ICD-10-CM | POA: Insufficient documentation

## 2012-10-30 ENCOUNTER — Other Ambulatory Visit: Payer: Self-pay | Admitting: Obstetrics

## 2012-10-30 DIAGNOSIS — R928 Other abnormal and inconclusive findings on diagnostic imaging of breast: Secondary | ICD-10-CM

## 2012-11-15 ENCOUNTER — Ambulatory Visit
Admission: RE | Admit: 2012-11-15 | Discharge: 2012-11-15 | Disposition: A | Discharge status: Home / Self Care | Payer: 59 | Source: Ambulatory Visit | Attending: Obstetrics | Admitting: Obstetrics

## 2012-11-15 DIAGNOSIS — R928 Other abnormal and inconclusive findings on diagnostic imaging of breast: Secondary | ICD-10-CM

## 2013-09-19 ENCOUNTER — Other Ambulatory Visit: Payer: Self-pay | Admitting: Family Medicine

## 2013-12-13 ENCOUNTER — Other Ambulatory Visit (HOSPITAL_COMMUNITY): Payer: Self-pay | Admitting: Obstetrics

## 2013-12-13 DIAGNOSIS — Z1231 Encounter for screening mammogram for malignant neoplasm of breast: Secondary | ICD-10-CM

## 2013-12-21 ENCOUNTER — Ambulatory Visit (HOSPITAL_COMMUNITY): Payer: 59 | Attending: Obstetrics

## 2014-01-03 ENCOUNTER — Ambulatory Visit (HOSPITAL_COMMUNITY)
Admission: RE | Admit: 2014-01-03 | Discharge: 2014-01-03 | Disposition: A | Discharge status: Home / Self Care | Payer: 59 | Source: Ambulatory Visit | Attending: Obstetrics | Admitting: Obstetrics

## 2014-01-03 ENCOUNTER — Other Ambulatory Visit: Payer: Self-pay | Admitting: Obstetrics

## 2014-01-03 DIAGNOSIS — Z1231 Encounter for screening mammogram for malignant neoplasm of breast: Secondary | ICD-10-CM | POA: Insufficient documentation

## 2014-01-03 DIAGNOSIS — R928 Other abnormal and inconclusive findings on diagnostic imaging of breast: Secondary | ICD-10-CM

## 2014-01-16 ENCOUNTER — Other Ambulatory Visit: Payer: Self-pay | Admitting: Obstetrics

## 2014-01-16 ENCOUNTER — Ambulatory Visit
Admission: RE | Admit: 2014-01-16 | Discharge: 2014-01-16 | Disposition: A | Discharge status: Home / Self Care | Payer: 59 | Source: Ambulatory Visit | Attending: Obstetrics | Admitting: Obstetrics

## 2014-01-16 DIAGNOSIS — R928 Other abnormal and inconclusive findings on diagnostic imaging of breast: Secondary | ICD-10-CM

## 2014-01-24 ENCOUNTER — Other Ambulatory Visit: Payer: Self-pay | Admitting: Obstetrics

## 2014-01-24 ENCOUNTER — Ambulatory Visit
Admission: RE | Admit: 2014-01-24 | Discharge: 2014-01-24 | Disposition: A | Discharge status: Home / Self Care | Payer: 59 | Source: Ambulatory Visit | Attending: Obstetrics | Admitting: Obstetrics

## 2014-01-24 DIAGNOSIS — R928 Other abnormal and inconclusive findings on diagnostic imaging of breast: Secondary | ICD-10-CM

## 2014-01-24 DIAGNOSIS — R921 Mammographic calcification found on diagnostic imaging of breast: Secondary | ICD-10-CM

## 2014-01-31 ENCOUNTER — Ambulatory Visit
Admission: RE | Admit: 2014-01-31 | Discharge: 2014-01-31 | Disposition: A | Discharge status: Home / Self Care | Payer: 59 | Source: Ambulatory Visit | Attending: Obstetrics | Admitting: Obstetrics

## 2014-01-31 DIAGNOSIS — R921 Mammographic calcification found on diagnostic imaging of breast: Secondary | ICD-10-CM

## 2014-01-31 DIAGNOSIS — R928 Other abnormal and inconclusive findings on diagnostic imaging of breast: Secondary | ICD-10-CM

## 2014-04-09 ENCOUNTER — Emergency Department (HOSPITAL_COMMUNITY)
Admission: EM | Admit: 2014-04-09 | Discharge: 2014-04-09 | Disposition: A | Discharge status: Home / Self Care | Payer: 59 | Attending: Emergency Medicine | Admitting: Emergency Medicine

## 2014-04-09 ENCOUNTER — Encounter (HOSPITAL_COMMUNITY): Payer: Self-pay

## 2014-04-09 DIAGNOSIS — Z79899 Other long term (current) drug therapy: Secondary | ICD-10-CM | POA: Insufficient documentation

## 2014-04-09 DIAGNOSIS — H6993 Unspecified Eustachian tube disorder, bilateral: Secondary | ICD-10-CM

## 2014-04-09 DIAGNOSIS — J069 Acute upper respiratory infection, unspecified: Secondary | ICD-10-CM

## 2014-04-09 DIAGNOSIS — Z87891 Personal history of nicotine dependence: Secondary | ICD-10-CM | POA: Diagnosis not present

## 2014-04-09 DIAGNOSIS — J029 Acute pharyngitis, unspecified: Secondary | ICD-10-CM | POA: Diagnosis present

## 2014-04-09 DIAGNOSIS — Z791 Long term (current) use of non-steroidal anti-inflammatories (NSAID): Secondary | ICD-10-CM | POA: Diagnosis not present

## 2014-04-09 DIAGNOSIS — R51 Headache: Secondary | ICD-10-CM | POA: Diagnosis not present

## 2014-04-09 DIAGNOSIS — R300 Dysuria: Secondary | ICD-10-CM

## 2014-04-09 DIAGNOSIS — H6983 Other specified disorders of Eustachian tube, bilateral: Secondary | ICD-10-CM

## 2014-04-09 LAB — URINALYSIS, ROUTINE W REFLEX MICROSCOPIC
BILIRUBIN URINE: NEGATIVE
GLUCOSE, UA: NEGATIVE mg/dL
KETONES UR: NEGATIVE mg/dL
NITRITE: NEGATIVE
PH: 5 (ref 5.0–8.0)
Protein, ur: NEGATIVE mg/dL
Specific Gravity, Urine: 1.024 (ref 1.005–1.030)
Urobilinogen, UA: 0.2 mg/dL (ref 0.0–1.0)

## 2014-04-09 LAB — RAPID STREP SCREEN (MED CTR MEBANE ONLY): STREPTOCOCCUS, GROUP A SCREEN (DIRECT): NEGATIVE

## 2014-04-09 LAB — URINE MICROSCOPIC-ADD ON

## 2014-04-09 MED ORDER — ONDANSETRON 4 MG PO TBDP
4.0000 mg | ORAL_TABLET | Freq: Once | ORAL | Status: AC
  Administered 2014-04-09: 4 mg via ORAL
  Filled 2014-04-09: qty 1

## 2014-04-09 MED ORDER — ACETAMINOPHEN 325 MG PO TABS
650.0000 mg | ORAL_TABLET | Freq: Once | ORAL | Status: AC
  Administered 2014-04-09: 650 mg via ORAL
  Filled 2014-04-09: qty 2

## 2014-04-09 MED ORDER — NAPROXEN 500 MG PO TABS: 500.0000 mg | ORAL_TABLET | Freq: Two times a day (BID) | ORAL | Status: DC

## 2014-04-09 MED ORDER — FLUTICASONE PROPIONATE 50 MCG/ACT NA SUSP: 2.0000 | Freq: Every day | NASAL | Status: DC

## 2014-04-09 MED ORDER — ONDANSETRON 4 MG PO TBDP: 4.0000 mg | ORAL_TABLET | Freq: Three times a day (TID) | ORAL | Status: DC | PRN

## 2014-04-09 NOTE — ED Provider Notes (Signed)
CSN: [id number]     Arrival date & time 04/09/14  1418 History  This chart was scribed for non-physician practitioner, Everlene Farrier, working with Purvis Sheffield, MD by Richarda Overlie, ED Scribe. This patient was seen in room WTR6/WTR6 and the patient's care was started at 5:21 PM.    Chief Complaint  Patient presents with  . Sore Throat  . Generalized Body Aches   The history is provided by the patient. No language interpreter was used.   HPI Comments: Tabitha Winters is a 49 y.o. female who presents to the Emergency Department of sore throat that started yesterday. Pt reports associated chills, HA, ear pain that she describes as a pressure, mild post-nasal drip and body aches. She says that her HA causes her to be nauseated sometimes but says she is not currently nauseated. She states that she has been experiencing some abdominal cramping as well. Pt reports no known sick contacts. She states that she received a flu shot this year. Pt also reports that she has been experiencing dysuria that started today. She denies fever, nasal congestion, itchy or watery eyes, hematuria, frequency, vaginal bleeding or urgency.    History reviewed. No pertinent past medical history. Past Surgical History  Procedure Laterality Date  . Abdominal hysterectomy     History reviewed. No pertinent family history. History  Substance Use Topics  . Smoking status: Former Smoker -- 0.50 packs/day for 15 years    Types: Cigarettes  . Smokeless tobacco: Not on file  . Alcohol Use: No     Comment: social   OB History    No data available     Review of Systems  Constitutional: Positive for chills and fatigue. Negative for fever.  HENT: Positive for ear pain, postnasal drip, rhinorrhea and sore throat. Negative for congestion, dental problem, ear discharge, sinus pressure and trouble swallowing.   Eyes: Negative for redness, itching and visual disturbance.  Respiratory: Negative for cough, shortness of  breath and wheezing.   Cardiovascular: Negative for chest pain.  Gastrointestinal: Positive for nausea and abdominal pain. Negative for vomiting, diarrhea, constipation and blood in stool.  Genitourinary: Positive for dysuria. Negative for urgency, frequency, hematuria, flank pain, vaginal bleeding, vaginal discharge and difficulty urinating.  Musculoskeletal: Positive for myalgias.  Skin: Negative for rash.  Neurological: Positive for headaches. Negative for weakness and light-headedness.    Allergies  Review of patient's allergies indicates no known allergies.  Home Medications   Prior to Admission medications   Medication Sig Start Date End Date Taking? Authorizing Provider  albuterol (PROVENTIL HFA;VENTOLIN HFA) 108 (90 BASE) MCG/ACT inhaler Inhale 2 puffs into the lungs every 6 (six) hours as needed for wheezing or shortness of breath.   Yes Historical Provider, MD  Aspirin-Acetaminophen-Caffeine (GOODY HEADACHE PO) Take 1 each by mouth daily as needed (headache.).   Yes Historical Provider, MD  ibuprofen (ADVIL,MOTRIN) 200 MG tablet Take 400 mg by mouth every 6 (six) hours as needed for headache or moderate pain.   Yes Historical Provider, MD  loratadine (CLARITIN) 10 MG tablet Take 10 mg by mouth daily.   Yes Historical Provider, MD  montelukast (SINGULAIR) 10 MG tablet Take 10 mg by mouth at bedtime.   Yes Historical Provider, MD  Olopatadine HCl (PATADAY OP) Apply 1 drop to eye daily.   Yes Historical Provider, MD  fluticasone (FLONASE) 50 MCG/ACT nasal spray Place 2 sprays into both nostrils daily. 04/09/14   Everlene Farrier, PA-C  ibuprofen (ADVIL,MOTRIN) 800 MG tablet Take  1 tablet (800 mg total) by mouth every 8 (eight) hours as needed for pain. Patient not taking: Reported on 04/09/2014 06/06/12   Trixie Dredge, PA-C  naproxen (NAPROSYN) 500 MG tablet Take 1 tablet (500 mg total) by mouth 2 (two) times daily with a meal. 04/09/14   Everlene Farrier, PA-C  ondansetron (ZOFRAN ODT) 4 MG  disintegrating tablet Take 1 tablet (4 mg total) by mouth every 8 (eight) hours as needed for nausea or vomiting. 04/09/14   Everlene Farrier, PA-C   BP 120/68 mmHg  Pulse 95  Temp(Src) 98.4 F (36.9 C) (Oral)  Resp 16  SpO2 98% Physical Exam  Constitutional: She is oriented to person, place, and time. She appears well-developed and well-nourished. No distress.  Nontoxic appearing.  HENT:  Head: Normocephalic and atraumatic.  Right Ear: External ear normal.  Left Ear: External ear normal.  Nose: Nose normal.  Mouth/Throat: Oropharynx is clear and moist. No oropharyngeal exudate.  Bilateral tympanic membranes are pearly gray without erythema or loss of landmarks. Small amount of clear inner ear serious fluid bilaterally.  No tonsillar hypertrophy or exudates.   Eyes: Conjunctivae are normal. Pupils are equal, round, and reactive to light. Right eye exhibits no discharge. Left eye exhibits no discharge.  Neck: Neck supple. No JVD present. No tracheal deviation present.  Cardiovascular: Normal rate, regular rhythm, normal heart sounds and intact distal pulses.  Exam reveals no gallop and no friction rub.   No murmur heard. Pulmonary/Chest: Effort normal and breath sounds normal. No respiratory distress. She has no wheezes. She has no rales.  Abdominal: Soft. She exhibits no distension and no mass. There is tenderness. There is no rebound and no guarding.  Abdomen is soft. Bowel sounds are present. Mild right lower quadrant tenderness to palpation. No rebound tenderness. Negative Rovsing sign. Negative psoas and obturator sign.  Lymphadenopathy:    She has no cervical adenopathy.  Neurological: She is alert and oriented to person, place, and time. Coordination normal.  Skin: Skin is warm and dry. No rash noted. She is not diaphoretic. No erythema. No pallor.  Psychiatric: She has a normal mood and affect. Her behavior is normal.  Nursing note and vitals reviewed.   ED Course  Procedures    DIAGNOSTIC STUDIES: Oxygen Saturation is 99% on RA, normal by my interpretation.    COORDINATION OF CARE: 5:30 PM Discussed treatment plan with pt at bedside and pt agreed to plan.   Labs Review Labs Reviewed  URINALYSIS, ROUTINE W REFLEX MICROSCOPIC - Abnormal; Notable for the following:    APPearance CLOUDY (*)    Hgb urine dipstick MODERATE (*)    Leukocytes, UA SMALL (*)    All other components within normal limits  URINE MICROSCOPIC-ADD ON - Abnormal; Notable for the following:    Crystals URIC ACID CRYSTALS (*)    All other components within normal limits  RAPID STREP SCREEN  CULTURE, GROUP A STREP  URINE CULTURE    Imaging Review No results found.   EKG Interpretation None      Filed Vitals:   04/09/14 1439 04/09/14 1926  BP: 120/68   Pulse: 87 95  Temp: 98.4 F (36.9 C)   TempSrc: Oral   Resp: 16   SpO2: 99% 98%     MDM   Meds given in ED:  Medications  acetaminophen (TYLENOL) tablet 650 mg (650 mg Oral Given 04/09/14 1752)  ondansetron (ZOFRAN-ODT) disintegrating tablet 4 mg (4 mg Oral Given 04/09/14 1925)  Discharge Medication List as of 04/09/2014  7:20 PM    START taking these medications   Details  fluticasone (FLONASE) 50 MCG/ACT nasal spray Place 2 sprays into both nostrils daily., Starting 04/09/2014, Until Discontinued, Print    naproxen (NAPROSYN) 500 MG tablet Take 1 tablet (500 mg total) by mouth 2 (two) times daily with a meal., Starting 04/09/2014, Until Discontinued, Print    ondansetron (ZOFRAN ODT) 4 MG disintegrating tablet Take 1 tablet (4 mg total) by mouth every 8 (eight) hours as needed for nausea or vomiting., Starting 04/09/2014, Until Discontinued, Print        Final diagnoses:  Upper respiratory infection  Eustachian tube dysfunction, bilateral  Dysuria   This is a 49 y.o. female who presents to the Emergency Department of sore throat that started yesterday. Pt reports associated chills, HA, ear pain that she describes as a  pressure, mild post-nasal drip and body aches. She says that her HA causes her to be nauseated sometimes but says she is not currently nauseated. She states that she has been experiencing some abdominal cramping as well. The patient is afebrile and nontoxic appearing. She has some mild bilateral inner ear fluid. Bilateral tympanic membranes are pearly gray without erythema or loss of landmarks. She has no tonsillar hypertrophy or exudates. On abdominal exam the patient does have mild right lower quadrant tenderness to palpation. No peritoneal signs. Negative so as not greater sign. No rebound tenderness. She does, she has dysuria on examination. Will obtain urinalysis and rapid strep. I also suggested further blood work for evaluation of her abdominal pain but she refuses this. Patient is able to tolerate oral Tylenol and water while in the ED. UA returns with moderate hemoglobin and small leukocytes. This is consistent with her previous urinalysis. Her urinalysis is nitrite negative. Will send urine for culture. Rapid strep is negative. Reevaluation the patient reports feeling slightly nauseated for her abdominal pain has resolved. Strict return precautions provided. I advised to return to the emergency department if she has worsening abdominal pain, unable to keep liquids down, or any new concerns. I advised the patient to follow-up with their primary care provider this week. I advised the patient to return to the emergency department with new or worsening symptoms or new concerns. The patient verbalized understanding and agreement with plan.   I personally performed the services described in this documentation, which was scribed in my presence. The recorded information has been reviewed and is accurate.      Everlene FarrierWilliam Yarieliz Wasser, PA-C 04/09/14 1933  Purvis SheffieldForrest Harrison, MD 04/09/14 [phone]

## 2014-04-09 NOTE — Discharge Instructions (Signed)
Upper Respiratory Infection, Adult °An upper respiratory infection (URI) is also sometimes known as the common cold. The upper respiratory tract includes the nose, sinuses, throat, trachea, and bronchi. Bronchi are the airways leading to the lungs. Most people improve within 1 week, but symptoms can last up to 2 weeks. A residual cough may last even longer.  °CAUSES °Many different viruses can infect the tissues lining the upper respiratory tract. The tissues become irritated and inflamed and often become very moist. Mucus production is also common. A cold is contagious. You can easily spread the virus to others by oral contact. This includes kissing, sharing a glass, coughing, or sneezing. Touching your mouth or nose and then touching a surface, which is then touched by another person, can also spread the virus. °SYMPTOMS  °Symptoms typically develop 1 to 3 days after you come in contact with a cold virus. Symptoms vary from person to person. They may include: °· Runny nose. °· Sneezing. °· Nasal congestion. °· Sinus irritation. °· Sore throat. °· Loss of voice (laryngitis). °· Cough. °· Fatigue. °· Muscle aches. °· Loss of appetite. °· Headache. °· Low-grade fever. °DIAGNOSIS  °You might diagnose your own cold based on familiar symptoms, since most people get a cold 2 to 3 times a year. Your caregiver can confirm this based on your exam. Most importantly, your caregiver can check that your symptoms are not due to another disease such as strep throat, sinusitis, pneumonia, asthma, or epiglottitis. Blood tests, throat tests, and X-rays are not necessary to diagnose a common cold, but they may sometimes be helpful in excluding other more serious diseases. Your caregiver will decide if any further tests are required. °RISKS AND COMPLICATIONS  °You may be at risk for a more severe case of the common cold if you smoke cigarettes, have chronic heart disease (such as heart failure) or lung disease (such as asthma), or if  you have a weakened immune system. The very young and very old are also at risk for more serious infections. Bacterial sinusitis, middle ear infections, and bacterial pneumonia can complicate the common cold. The common cold can worsen asthma and chronic obstructive pulmonary disease (COPD). Sometimes, these complications can require emergency medical care and may be life-threatening. °PREVENTION  °The best way to protect against getting a cold is to practice good hygiene. Avoid oral or hand contact with people with cold symptoms. Wash your hands often if contact occurs. There is no clear evidence that vitamin C, vitamin E, echinacea, or exercise reduces the chance of developing a cold. However, it is always recommended to get plenty of rest and practice good nutrition. °TREATMENT  °Treatment is directed at relieving symptoms. There is no cure. Antibiotics are not effective, because the infection is caused by a virus, not by bacteria. Treatment may include: °· Increased fluid intake. Sports drinks offer valuable electrolytes, sugars, and fluids. °· Breathing heated mist or steam (vaporizer or shower). °· Eating chicken soup or other clear broths, and maintaining good nutrition. °· Getting plenty of rest. °· Using gargles or lozenges for comfort. °· Controlling fevers with ibuprofen or acetaminophen as directed by your caregiver. °· Increasing usage of your inhaler if you have asthma. °Zinc gel and zinc lozenges, taken in the first 24 hours of the common cold, can shorten the duration and lessen the severity of symptoms. Pain medicines may help with fever, muscle aches, and throat pain. A variety of non-prescription medicines are available to treat congestion and runny nose. Your caregiver   can make recommendations and may suggest nasal or lung inhalers for other symptoms.  °HOME CARE INSTRUCTIONS  °· Only take over-the-counter or prescription medicines for pain, discomfort, or fever as directed by your  caregiver. °· Use a warm mist humidifier or inhale steam from a shower to increase air moisture. This may keep secretions moist and make it easier to breathe. °· Drink enough water and fluids to keep your urine clear or pale yellow. °· Rest as needed. °· Return to work when your temperature has returned to normal or as your caregiver advises. You may need to stay home longer to avoid infecting others. You can also use a face mask and careful hand washing to prevent spread of the virus. °SEEK MEDICAL CARE IF:  °· After the first few days, you feel you are getting worse rather than better. °· You need your caregiver's advice about medicines to control symptoms. °· You develop chills, worsening shortness of breath, or brown or red sputum. These may be signs of pneumonia. °· You develop yellow or brown nasal discharge or pain in the face, especially when you bend forward. These may be signs of sinusitis. °· You develop a fever, swollen neck glands, pain with swallowing, or white areas in the back of your throat. These may be signs of strep throat. °SEEK IMMEDIATE MEDICAL CARE IF:  °· You have a fever. °· You develop severe or persistent headache, ear pain, sinus pain, or chest pain. °· You develop wheezing, a prolonged cough, cough up blood, or have a change in your usual mucus (if you have chronic lung disease). °· You develop sore muscles or a stiff neck. °Document Released: 06/16/2000 Document Revised: 03/15/2011 Document Reviewed: 03/28/2013 °[employer]® Patient Information ©2015 [employer]. This information is not intended to replace advice given to you by your health care provider. Make sure you discuss any questions you have with your health care provider. °Barotitis Media °Barotitis media is inflammation of your middle ear. This occurs when the auditory tube (eustachian tube) leading from the back of your nose (nasopharynx) to your eardrum is blocked. This blockage may result from a cold, environmental  allergies, or an upper respiratory infection. Unresolved barotitis media may lead to damage or hearing loss (barotrauma), which may become permanent. °HOME CARE INSTRUCTIONS  °· Use medicines as recommended by your health care provider. Over-the-counter medicines will help unblock the canal and can help during times of air travel. °· Do not put anything into your ears to clean or unplug them. Eardrops will not be helpful. °· Do not swim, dive, or fly until your health care provider says it is all right to do so. If these activities are necessary, chewing gum with frequent, forceful swallowing may help. It is also helpful to hold your nose and gently blow to pop your ears for equalizing pressure changes. This forces air into the eustachian tube. °· Only take over-the-counter or prescription medicines for pain, discomfort, or fever as directed by your health care provider. °· A decongestant may be helpful in decongesting the middle ear and make pressure equalization easier. °SEEK MEDICAL CARE IF: °· You experience a serious form of dizziness in which you feel as if the room is spinning and you feel nauseated (vertigo). °· Your symptoms only involve one ear. °SEEK IMMEDIATE MEDICAL CARE IF:  °· You develop a severe headache, dizziness, or severe ear pain. °· You have bloody or pus-like drainage from your ears. °· You develop a fever. °· Your problems do   not improve or become worse. MAKE SURE YOU:   Understand these instructions.  Will watch your condition.  Will get help right away if you are not doing well or get worse. Document Released: 12/19/1999 Document Revised: 10/11/2012 Document Reviewed: 07/18/2012 [facility] Patient Information 2015 [place], [place]. This information is not intended to replace advice given to you by your health care provider. Make sure you discuss any questions you have with your health care provider.  Abdominal Pain Many things can cause abdominal pain. Usually, abdominal pain is  not caused by a disease and will improve without treatment. It can often be observed and treated at home. Your health care provider will do a physical exam and possibly order blood tests and X-rays to help determine the seriousness of your pain. However, in many cases, more time must pass before a clear cause of the pain can be found. Before that point, your health care provider may not know if you need more testing or further treatment. HOME CARE INSTRUCTIONS  Monitor your abdominal pain for any changes. The following actions may help to alleviate any discomfort you are experiencing:  Only take over-the-counter or prescription medicines as directed by your health care provider.  Do not take laxatives unless directed to do so by your health care provider.  Try a clear liquid diet (broth, tea, or water) as directed by your health care provider. Slowly move to a bland diet as tolerated. SEEK MEDICAL CARE IF:  You have unexplained abdominal pain.  You have abdominal pain associated with nausea or diarrhea.  You have pain when you urinate or have a bowel movement.  You experience abdominal pain that wakes you in the night.  You have abdominal pain that is worsened or improved by eating food.  You have abdominal pain that is worsened with eating fatty foods.  You have a fever. SEEK IMMEDIATE MEDICAL CARE IF:   Your pain does not go away within 2 hours.  You keep throwing up (vomiting).  Your pain is felt only in portions of the abdomen, such as the right side or the left lower portion of the abdomen.  You pass bloody or black tarry stools. MAKE SURE YOU:  Understand these instructions.   Will watch your condition.   Will get help right away if you are not doing well or get worse.  Document Released: 09/30/2004 Document Revised: 12/26/2012 Document Reviewed: 08/30/2012 [facility] Patient Information 2015 [place], [place]. This information is not intended to replace advice given to  you by your health care provider. Make sure you discuss any questions you have with your health care provider.  Dysuria Dysuria is the medical term for pain with urination. There are many causes for dysuria, but urinary tract infection is the most common. If a urinalysis was performed it can show that there is a urinary tract infection. A urine culture confirms that you or your child is sick. You will need to follow up with a healthcare provider because:  If a urine culture was done you will need to know the culture results and treatment recommendations.  If the urine culture was positive, you or your child will need to be put on antibiotics or know if the antibiotics prescribed are the right antibiotics for your urinary tract infection.  If the urine culture is negative (no urinary tract infection), then other causes may need to be explored or antibiotics need to be stopped. Today laboratory work may have been done and there does not seem to  be an infection. If cultures were done they will take at least 24 to 48 hours to be completed. Today x-rays may have been taken and they read as normal. No cause can be found for the problems. The x-rays may be re-read by a radiologist and you will be contacted if additional findings are made. You or your child may have been put on medications to help with this problem until you can see your primary caregiver. If the problems get better, see your primary caregiver if the problems return. If you were given antibiotics (medications which kill germs), take all of the mediations as directed for the full course of treatment.  If laboratory work was done, you need to find the results. Leave a telephone number where you can be reached. If this is not possible, make sure you find out how you are to get test results. HOME CARE INSTRUCTIONS   Drink lots of fluids. For adults, drink eight, 8 ounce glasses of clear juice or water a day. For children, replace fluids as  suggested by your caregiver.  Empty the bladder often. Avoid holding urine for long periods of time.  After a bowel movement, women should cleanse front to back, using each tissue only once.  Empty your bladder before and after sexual intercourse.  Take all the medicine given to you until it is gone. You may feel better in a few days, but TAKE ALL MEDICINE.  Avoid caffeine, tea, alcohol and carbonated beverages, because they tend to irritate the bladder.  In men, alcohol may irritate the prostate.  Only take over-the-counter or prescription medicines for pain, discomfort, or fever as directed by your caregiver.  If your caregiver has given you a follow-up appointment, it is very important to keep that appointment. Not keeping the appointment could result in a chronic or permanent injury, pain, and disability. If there is any problem keeping the appointment, you must call back to this facility for assistance. SEEK IMMEDIATE MEDICAL CARE IF:   Back pain develops.  A fever develops.  There is nausea (feeling sick to your stomach) or vomiting (throwing up).  Problems are no better with medications or are getting worse. MAKE SURE YOU:   Understand these instructions.  Will watch your condition.  Will get help right away if you are not doing well or get worse. Document Released: 09/19/2003 Document Revised: 03/15/2011 Document Reviewed: 07/27/2007 [facility] Patient Information 2015 [place], [place]. This information is not intended to replace advice given to you by your health care provider. Make sure you discuss any questions you have with your health care provider.

## 2014-04-09 NOTE — ED Notes (Signed)
Pt with sore throat, headache and body aches since yesterday.  Difficulty swallowing.  No fever

## 2014-04-10 LAB — URINE CULTURE
Colony Count: NO GROWTH
Culture: NO GROWTH

## 2014-04-12 LAB — CULTURE, GROUP A STREP: STREP A CULTURE: NEGATIVE

## 2014-04-18 ENCOUNTER — Encounter (HOSPITAL_COMMUNITY): Payer: Self-pay | Admitting: Emergency Medicine

## 2014-04-18 ENCOUNTER — Emergency Department (HOSPITAL_COMMUNITY)
Admission: EM | Admit: 2014-04-18 | Discharge: 2014-04-18 | Disposition: A | Discharge status: Home / Self Care | Payer: 59 | Source: Home / Self Care | Attending: Family Medicine | Admitting: Family Medicine

## 2014-04-18 DIAGNOSIS — J029 Acute pharyngitis, unspecified: Secondary | ICD-10-CM

## 2014-04-18 LAB — POCT RAPID STREP A: Streptococcus, Group A Screen (Direct): NEGATIVE

## 2014-04-18 MED ORDER — PREDNISONE 10 MG PO TABS: 30.0000 mg | ORAL_TABLET | Freq: Every day | ORAL | Status: DC

## 2014-04-18 MED ORDER — AZITHROMYCIN 250 MG PO TABS: 250.0000 mg | ORAL_TABLET | Freq: Every day | ORAL | Status: DC

## 2014-04-18 NOTE — ED Notes (Signed)
Reports fluid in ears/pressure, cough up green phlegm, blowing and coughing up green phlegm, no fever, and reports having chills

## 2014-04-18 NOTE — Discharge Instructions (Signed)
Thank you for coming in today. Take prednisone daily. Use Rhinocort nasal spray. If not better take azithromycin. Call or go to the emergency room if you get worse, have trouble breathing, have chest pains, or palpitations.   Pharyngitis Pharyngitis is redness, pain, and swelling (inflammation) of your pharynx.  CAUSES  Pharyngitis is usually caused by infection. Most of the time, these infections are from viruses (viral) and are part of a cold. However, sometimes pharyngitis is caused by bacteria (bacterial). Pharyngitis can also be caused by allergies. Viral pharyngitis may be spread from person to person by coughing, sneezing, and personal items or utensils (cups, forks, spoons, toothbrushes). Bacterial pharyngitis may be spread from person to person by more intimate contact, such as kissing.  SIGNS AND SYMPTOMS  Symptoms of pharyngitis include:   Sore throat.   Tiredness (fatigue).   Low-grade fever.   Headache.  Joint pain and muscle aches.  Skin rashes.  Swollen lymph nodes.  Plaque-like film on throat or tonsils (often seen with bacterial pharyngitis). DIAGNOSIS  Your health care provider will ask you questions about your illness and your symptoms. Your medical history, along with a physical exam, is often all that is needed to diagnose pharyngitis. Sometimes, a rapid strep test is done. Other lab tests may also be done, depending on the suspected cause.  TREATMENT  Viral pharyngitis will usually get better in 3-4 days without the use of medicine. Bacterial pharyngitis is treated with medicines that kill germs (antibiotics).  HOME CARE INSTRUCTIONS   Drink enough water and fluids to keep your urine clear or pale yellow.   Only take over-the-counter or prescription medicines as directed by your health care provider:   If you are prescribed antibiotics, make sure you finish them even if you start to feel better.   Do not take aspirin.   Get lots of rest.    Gargle with 8 oz of salt water ( tsp of salt per 1 qt of water) as often as every 1-2 hours to soothe your throat.   Throat lozenges (if you are not at risk for choking) or sprays may be used to soothe your throat. SEEK MEDICAL CARE IF:   You have large, tender lumps in your neck.  You have a rash.  You cough up green, yellow-brown, or bloody spit. SEEK IMMEDIATE MEDICAL CARE IF:   Your neck becomes stiff.  You drool or are unable to swallow liquids.  You vomit or are unable to keep medicines or liquids down.  You have severe pain that does not go away with the use of recommended medicines.  You have trouble breathing (not caused by a stuffy nose). MAKE SURE YOU:   Understand these instructions.  Will watch your condition.  Will get help right away if you are not doing well or get worse. Document Released: 12/21/2004 Document Revised: 10/11/2012 Document Reviewed: 08/28/2012 [facility] Patient Information 2015 [place], [place]. This information is not intended to replace advice given to you by your health care provider. Make sure you discuss any questions you have with your health care provider.

## 2014-04-18 NOTE — ED Provider Notes (Signed)
Tabitha Winters is a 49 y.o. female who presents to Urgent Care today for sClydia Llanoore throat ear pressure cough congestion. Patient was seen about 8 days ago at the emergency department. She had a negative strep test and was thought to have viral versus allergy processes. She was given naproxen Zofran and Flonase. She sees these medicines seem to work okay. She worsened yesterday. No vomiting. She quit smoking 3 weeks ago.   History reviewed. No pertinent past medical history. Past Surgical History  Procedure Laterality Date  . Abdominal hysterectomy     History  Substance Use Topics  . Smoking status: Former Smoker -- 0.50 packs/day for 15 years    Types: Cigarettes  . Smokeless tobacco: Not on file  . Alcohol Use: No     Comment: social   ROS as above Medications: No current facility-administered medications for this encounter.   Current Outpatient Prescriptions  Medication Sig Dispense Refill  . albuterol (PROVENTIL HFA;VENTOLIN HFA) 108 (90 BASE) MCG/ACT inhaler Inhale 2 puffs into the lungs every 6 (six) hours as needed for wheezing or shortness of breath.    . Aspirin-Acetaminophen-Caffeine (GOODY HEADACHE PO) Take 1 each by mouth daily as needed (headache.).    Marland Kitchen. azithromycin (ZITHROMAX) 250 MG tablet Take 1 tablet (250 mg total) by mouth daily. Take first 2 tablets together, then 1 every day until finished. 6 tablet 0  . fluticasone (FLONASE) 50 MCG/ACT nasal spray Place 2 sprays into both nostrils daily. 16 g 0  . ibuprofen (ADVIL,MOTRIN) 200 MG tablet Take 400 mg by mouth every 6 (six) hours as needed for headache or moderate pain.    Marland Kitchen. ibuprofen (ADVIL,MOTRIN) 800 MG tablet Take 1 tablet (800 mg total) by mouth every 8 (eight) hours as needed for pain. (Patient not taking: Reported on 04/09/2014) 15 tablet 0  . loratadine (CLARITIN) 10 MG tablet Take 10 mg by mouth daily.    . montelukast (SINGULAIR) 10 MG tablet Take 10 mg by mouth at bedtime.    . naproxen (NAPROSYN) 500 MG tablet  Take 1 tablet (500 mg total) by mouth 2 (two) times daily with a meal. 30 tablet 0  . Olopatadine HCl (PATADAY OP) Apply 1 drop to eye daily.    . ondansetron (ZOFRAN ODT) 4 MG disintegrating tablet Take 1 tablet (4 mg total) by mouth every 8 (eight) hours as needed for nausea or vomiting. 10 tablet 0  . predniSONE (DELTASONE) 10 MG tablet Take 3 tablets (30 mg total) by mouth daily. 15 tablet 0   No Known Allergies   Exam:  BP 123/80 mmHg  Pulse 71  Temp(Src) 98.4 F (36.9 C) (Oral)  Resp 16  SpO2 99% Gen: Well NAD HEENT: EOMI,  MMM posterior pharynx with mild cobblestoning. Normal tympanic membranes bilaterally. Lungs: Normal work of breathing. CTABL Heart: RRR no MRG Abd: NABS, Soft. Nondistended, Nontender Exts: Brisk capillary refill, warm and well perfused.   Results for orders placed or performed during the hospital encounter of 04/18/14 (from the past 24 hour(s))  POCT rapid strep A [facility]     Status: None   Collection Time: 04/18/14  4:29 PM  Result Value Ref Range   Streptococcus, Group A Screen (Direct) NEGATIVE NEGATIVE   No results found.  Assessment and Plan: 49 y.o. female with pharyngitis likely viral. Culture pending treat with prednisone and Rhinocort and Zyrtec.  Discussed warning signs or symptoms. Please see discharge instructions. Patient expresses understanding.     Rodolph BongEvan S Barlow Harrison,  MD 04/18/14 1701

## 2014-04-22 LAB — CULTURE, GROUP A STREP: Strep A Culture: NEGATIVE

## 2014-06-12 ENCOUNTER — Other Ambulatory Visit: Payer: Self-pay | Admitting: Family Medicine

## 2015-01-24 MED FILL — MONTELUKAST SOD 10 MG TAB: 10 | 90 days supply | Qty: 90 | Fill #0

## 2015-03-26 ENCOUNTER — Other Ambulatory Visit: Payer: Self-pay

## 2015-03-26 DIAGNOSIS — Z1231 Encounter for screening mammogram for malignant neoplasm of breast: Secondary | ICD-10-CM

## 2015-04-11 ENCOUNTER — Ambulatory Visit: Payer: 59

## 2015-04-28 MED FILL — MONTELUKAST SOD 10 MG TAB: 10 | 90 days supply | Qty: 90 | Fill #1

## 2015-05-11 IMAGING — MG MM LT BREAST BX W LOC DEV 1ST LESION IMAGE BX SPEC STEREO GUIDE
2 series · 2 of 2 positions shown · non-contrast
Comparison: Previous exams.

ADDENDUM:
The final pathological diagnosis is fibrocystic changes with
calcifications.. This is concordant with the imaging findings. A
bilateral screening mammogram is recommended in 11 months.

The final pathological diagnosis and recommendation were discussed
with the patient by telephone on 02/01/2014. Her questions were
answered. She reported mild pain and bruising at the biopsy site
with no palpable hematoma.
CLINICAL DATA: 5 mm group of indeterminate microcalcifications in
the upper-outer quadrant of the left breast at recent mammography.
EXAM:
LEFT BREAST STEREOTACTIC CORE NEEDLE BIOPSY

[L SPECIMEN (1 of 2)]
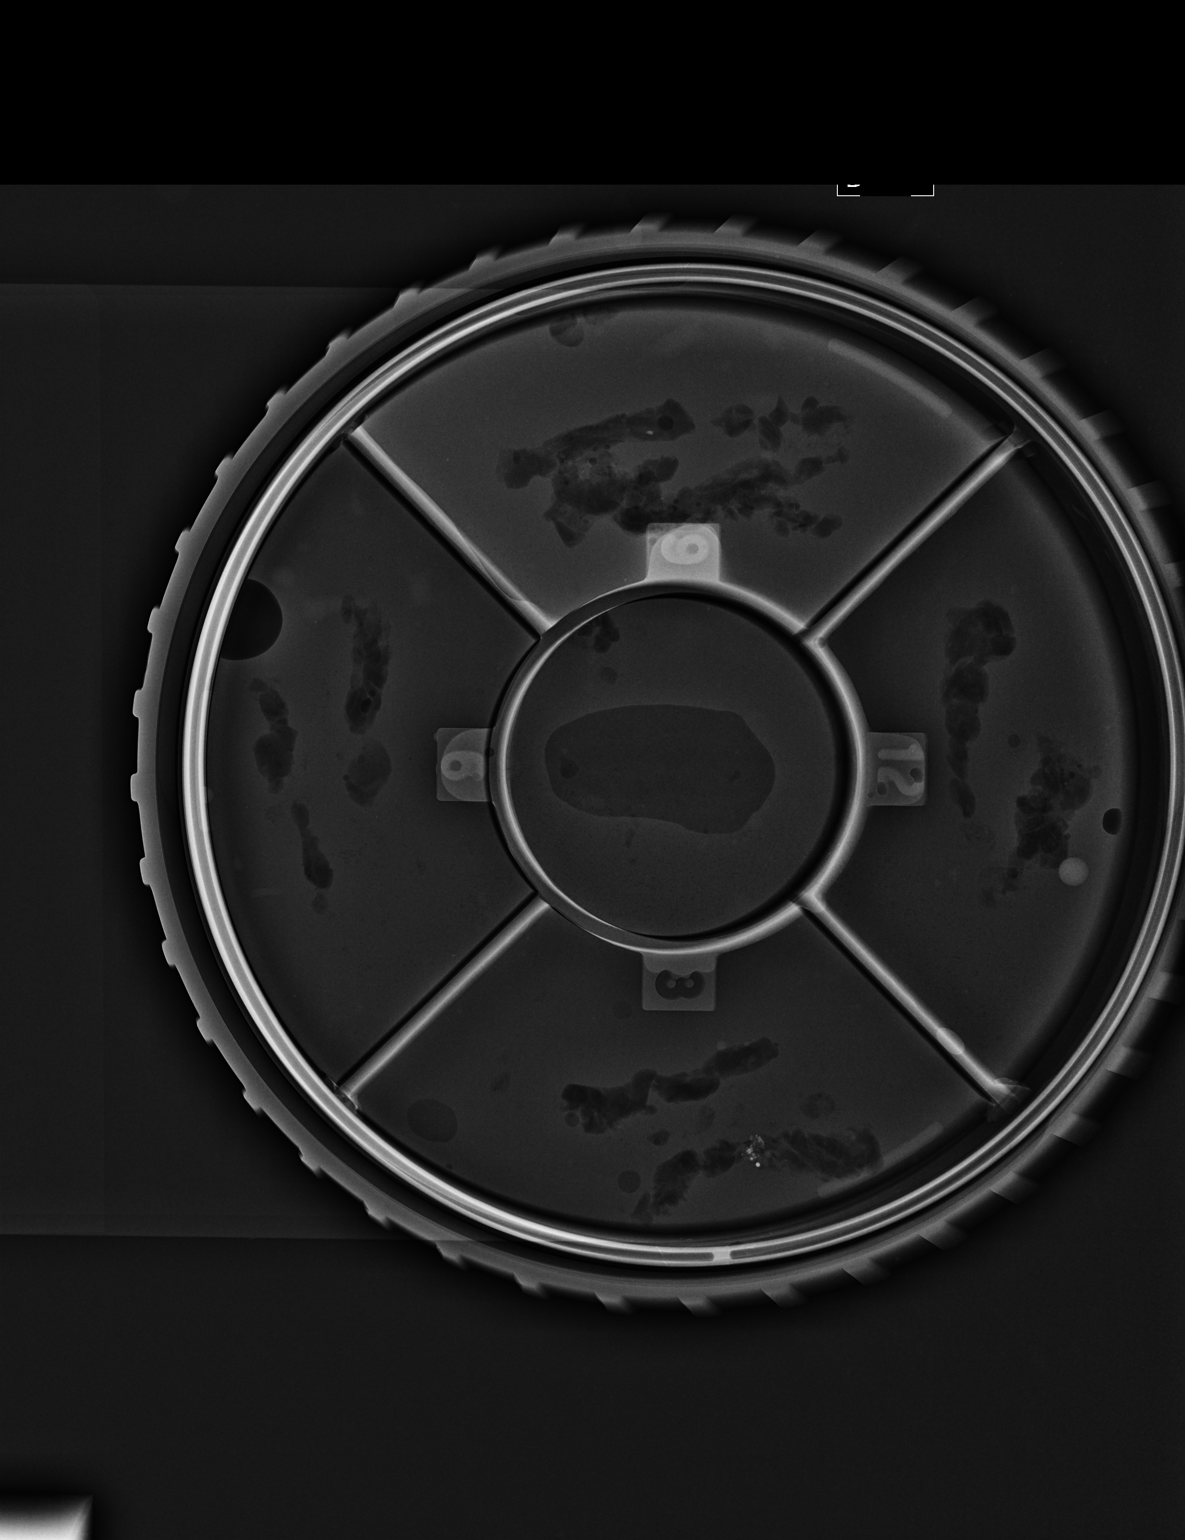

[L SPECIMEN (2 of 2)]
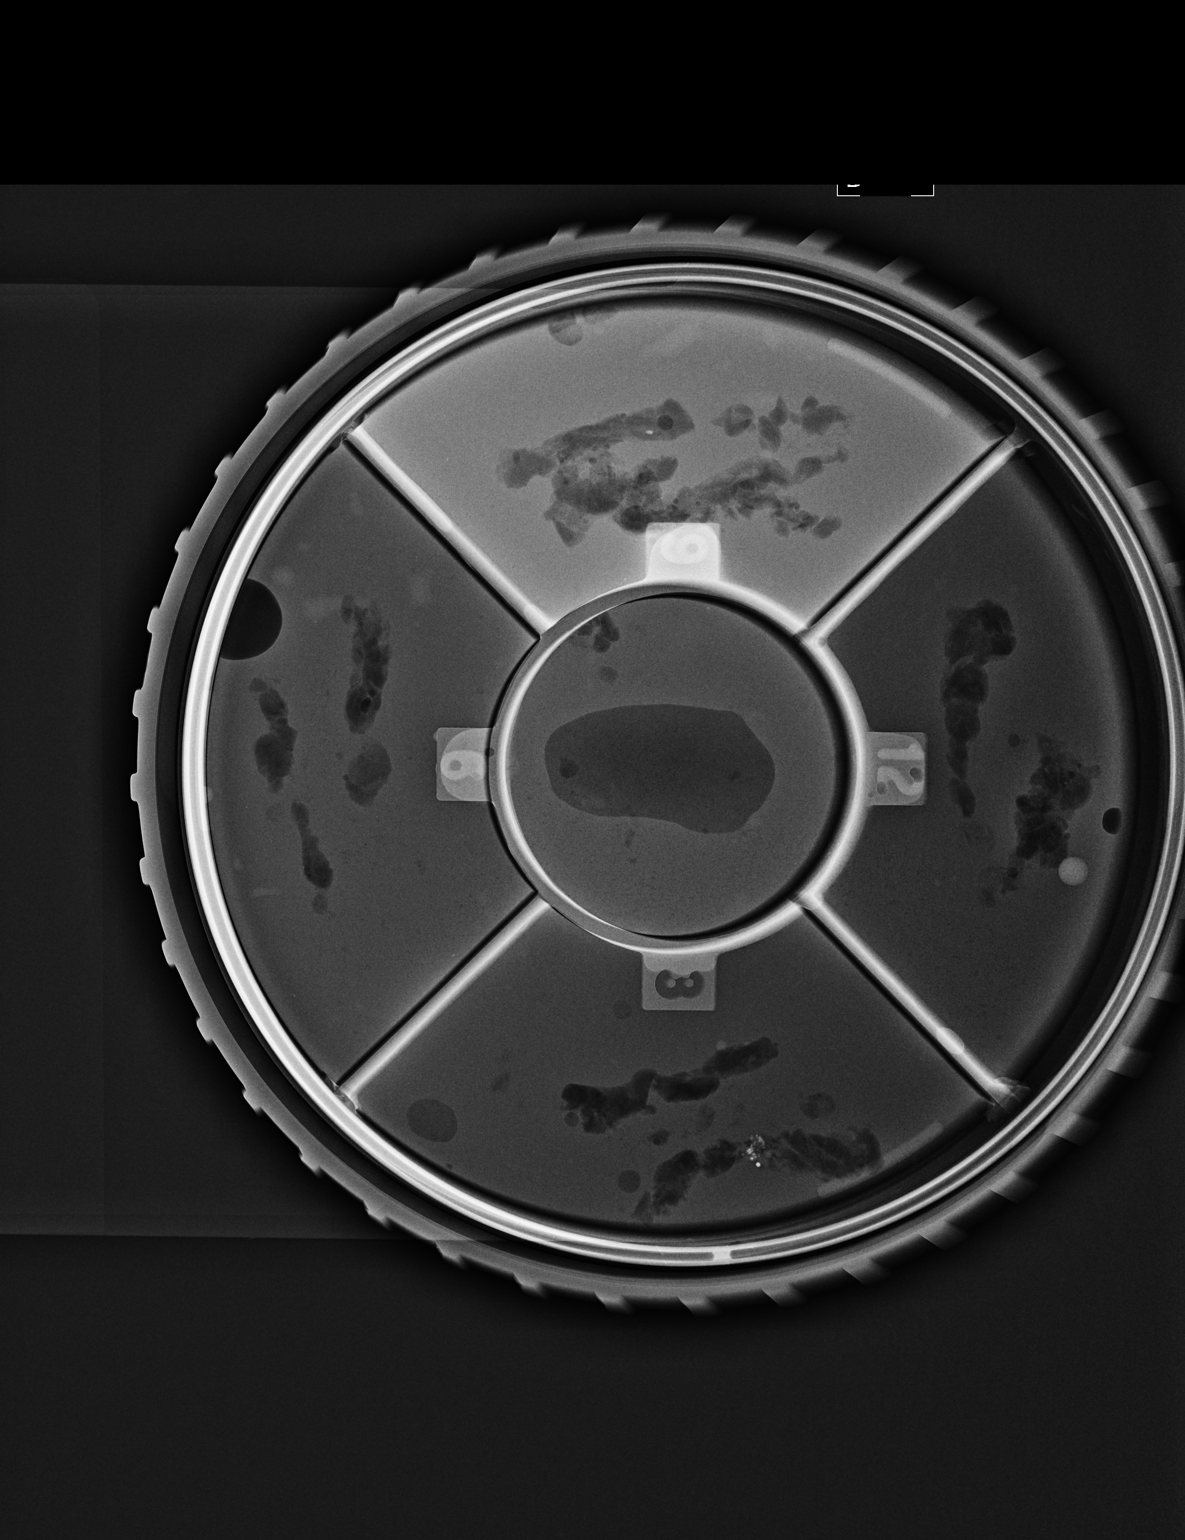

[2 of 2 positions shown; findings below may reference images not displayed]



Using sterile technique and 2% Lidocaine as local anesthetic, under
stereotactic guidance, a 9 gauge vacuum assisted device was used to
perform core needle biopsy of the recently demonstrated 5 mm group
of microcalcifications in the upper-outer left breast using a
lateral approach. Specimen radiograph was performed showing multiple
calcifications within 2 of the specimens. Specimens with
calcifications are identified for pathology.

At the conclusion of the procedure, a coil shaped tissue marker clip
was deployed into the biopsy cavity. Follow-up 2-view mammogram was
performed and is dictated separately.
IMPRESSION: Stereotactic-guided biopsy of a 5 mm group of microcalcifications in
the upper outer left breast. No apparent complications.

## 2015-05-28 MED FILL — AZITHROMYCIN 250 MG TABLET: 250 | 5 days supply | Qty: 6 | Fill #0

## 2015-06-10 DIAGNOSIS — J4520 Mild intermittent asthma, uncomplicated: Secondary | ICD-10-CM | POA: Diagnosis not present

## 2015-06-10 DIAGNOSIS — J301 Allergic rhinitis due to pollen: Secondary | ICD-10-CM | POA: Diagnosis not present

## 2015-06-10 DIAGNOSIS — R635 Abnormal weight gain: Secondary | ICD-10-CM | POA: Diagnosis not present

## 2015-07-04 MED FILL — MONTELUKAST SOD 10 MG TAB: 10 | 90 days supply | Qty: 90 | Fill #0

## 2015-10-07 MED FILL — MONTELUKAST SOD 10 MG TAB: 10 | 90 days supply | Qty: 90 | Fill #1

## 2015-11-24 ENCOUNTER — Other Ambulatory Visit: Payer: Self-pay | Admitting: Family Medicine

## 2015-11-24 DIAGNOSIS — Z1231 Encounter for screening mammogram for malignant neoplasm of breast: Secondary | ICD-10-CM

## 2015-12-03 MED FILL — MONTELUKAST SOD 10 MG TAB: 10 | 30 days supply | Qty: 30 | Fill #2

## 2015-12-31 ENCOUNTER — Ambulatory Visit: Payer: 59

## 2016-01-06 MED FILL — MONTELUKAST SOD 10 MG TAB: 10 | 90 days supply | Qty: 90 | Fill #3

## 2016-01-15 DIAGNOSIS — R635 Abnormal weight gain: Secondary | ICD-10-CM | POA: Diagnosis not present

## 2016-01-15 DIAGNOSIS — F4325 Adjustment disorder with mixed disturbance of emotions and conduct: Secondary | ICD-10-CM | POA: Diagnosis not present

## 2016-02-07 DIAGNOSIS — F4325 Adjustment disorder with mixed disturbance of emotions and conduct: Secondary | ICD-10-CM | POA: Diagnosis not present

## 2016-02-07 DIAGNOSIS — F3289 Other specified depressive episodes: Secondary | ICD-10-CM | POA: Diagnosis not present

## 2016-02-10 ENCOUNTER — Ambulatory Visit: Payer: 59

## 2016-02-10 MED FILL — ALPRAZolam 0.5 MG TABS: 0.5 | 30 days supply | Qty: 90 | Fill #0

## 2016-02-18 DIAGNOSIS — H25013 Cortical age-related cataract, bilateral: Secondary | ICD-10-CM | POA: Diagnosis not present

## 2016-02-18 DIAGNOSIS — H524 Presbyopia: Secondary | ICD-10-CM | POA: Diagnosis not present

## 2016-02-18 DIAGNOSIS — H18413 Arcus senilis, bilateral: Secondary | ICD-10-CM | POA: Diagnosis not present

## 2016-02-18 DIAGNOSIS — H11423 Conjunctival edema, bilateral: Secondary | ICD-10-CM | POA: Diagnosis not present

## 2016-02-18 DIAGNOSIS — H5213 Myopia, bilateral: Secondary | ICD-10-CM | POA: Diagnosis not present

## 2016-02-18 DIAGNOSIS — H52223 Regular astigmatism, bilateral: Secondary | ICD-10-CM | POA: Diagnosis not present

## 2016-02-18 DIAGNOSIS — H1045 Other chronic allergic conjunctivitis: Secondary | ICD-10-CM | POA: Diagnosis not present

## 2016-03-03 ENCOUNTER — Emergency Department (HOSPITAL_COMMUNITY)
Admission: EM | Admit: 2016-03-03 | Discharge: 2016-03-03 | Disposition: A | Discharge status: Home / Self Care | Payer: 59 | Attending: Emergency Medicine | Admitting: Emergency Medicine

## 2016-03-03 ENCOUNTER — Encounter (HOSPITAL_COMMUNITY): Payer: Self-pay

## 2016-03-03 DIAGNOSIS — R51 Headache: Secondary | ICD-10-CM | POA: Diagnosis not present

## 2016-03-03 DIAGNOSIS — Z7982 Long term (current) use of aspirin: Secondary | ICD-10-CM | POA: Diagnosis not present

## 2016-03-03 DIAGNOSIS — F17210 Nicotine dependence, cigarettes, uncomplicated: Secondary | ICD-10-CM | POA: Insufficient documentation

## 2016-03-03 DIAGNOSIS — J111 Influenza due to unidentified influenza virus with other respiratory manifestations: Secondary | ICD-10-CM | POA: Diagnosis not present

## 2016-03-03 DIAGNOSIS — R6889 Other general symptoms and signs: Secondary | ICD-10-CM

## 2016-03-03 DIAGNOSIS — Z79899 Other long term (current) drug therapy: Secondary | ICD-10-CM | POA: Insufficient documentation

## 2016-03-03 DIAGNOSIS — R05 Cough: Secondary | ICD-10-CM | POA: Diagnosis not present

## 2016-03-03 NOTE — ED Notes (Signed)
Patient states she took Ibuprofen 800 mg at 0730.

## 2016-03-03 NOTE — Discharge Instructions (Signed)
Rest and drink plenty of fluids Take OTC medicine (Ibuprofen, Theraflu/Dayquil) to help you feel better Use inhaler as needed for SOB/wheezing Wash your hands and cover your mouth when coughing/sneezing

## 2016-03-03 NOTE — ED Provider Notes (Signed)
[facility]ER[facility] Provider Note   CSN: [id number] Arrival date & time: 03/03/16  1104  By signing my name below, I, Tabitha Winters, attest that this documentation has been prepared under the direction and in the presence of Ivory Broad. Jefm Bryant, PA-C. Electronically Signed: Marnette Burgess Winters, Scribe. 03/03/2016. 11:49 AM.    History   Chief Complaint Chief Complaint  Patient presents with  . flu symptoms  . Shortness of Breath  . Otalgia    The history is provided by the patient. No language interpreter was used.   HPI Comments:  Tabitha Winters is a 51 y.o. female with no pertinent PMHx, who presents to the Emergency Department complaining of gradual onset, persistent, productive cough with green sputum,  onset two days ago. She reports waking up the morning of 03/01/16 with this cough that has worsened since that time. She has associated symptoms of generalized myalgias, HA, nausea, fever that is worse at night (TMax 102.3), sore throat, chills, diaphoresis, right ear pain, congestion, rhinorrhea, SOB, and diarrhea x1 that has since resolved. She has reduced PO intake as food makes her nauseous; her fluid intake is normal. She has tried Ibuprofen and Singulair at home with mild relief of her pain. Sick contact with similar symptoms noted at work here at [employer]. She has an inhaler at home but did not use it for relief of her breathing problems. Pt denies vomiting, abdominal pain, wheezing, and any other associated symptoms at this time. Pt is a current every day smoker. Pt did receive a seasonal influenza vaccination.    History reviewed. No pertinent past medical history.  There are no active problems to display for this patient.   Past Surgical History:  Procedure Laterality Date  . ABDOMINAL HYSTERECTOMY      OB History    No data available       Home Medications    Prior to Admission medications   Medication Sig Start Date End Date Taking? Authorizing Provider    albuterol (PROVENTIL HFA;VENTOLIN HFA) 108 (90 BASE) MCG/ACT inhaler Inhale 2 puffs into the lungs every 6 (six) hours as needed for wheezing or shortness of breath.    Historical Provider, MD  Aspirin-Acetaminophen-Caffeine (GOODY HEADACHE PO) Take 1 each by mouth daily as needed (headache.).    Historical Provider, MD  azithromycin (ZITHROMAX) 250 MG tablet Take 1 tablet (250 mg total) by mouth daily. Take first 2 tablets together, then 1 every day until finished. 04/18/14   Rodolph Bong, MD  fluticasone (FLONASE) 50 MCG/ACT nasal spray Place 2 sprays into both nostrils daily. 04/09/14   Everlene Farrier, PA-C  ibuprofen (ADVIL,MOTRIN) 200 MG tablet Take 400 mg by mouth every 6 (six) hours as needed for headache or moderate pain.    Historical Provider, MD  ibuprofen (ADVIL,MOTRIN) 800 MG tablet Take 1 tablet (800 mg total) by mouth every 8 (eight) hours as needed for pain. Patient not taking: Reported on 04/09/2014 06/06/12   Trixie Dredge, PA-C  loratadine (CLARITIN) 10 MG tablet Take 10 mg by mouth daily.    Historical Provider, MD  montelukast (SINGULAIR) 10 MG tablet Take 10 mg by mouth at bedtime.    Historical Provider, MD  naproxen (NAPROSYN) 500 MG tablet Take 1 tablet (500 mg total) by mouth 2 (two) times daily with a meal. 04/09/14   Everlene Farrier, PA-C  Olopatadine HCl (PATADAY OP) Apply 1 drop to eye daily.    Historical Provider, MD  ondansetron (ZOFRAN ODT) 4 MG  disintegrating tablet Take 1 tablet (4 mg total) by mouth every 8 (eight) hours as needed for nausea or vomiting. 04/09/14   Everlene FarrierWilliam Dansie, PA-C  predniSONE (DELTASONE) 10 MG tablet Take 3 tablets (30 mg total) by mouth daily. 04/18/14   Rodolph BongEvan S Corey, MD    Family History History reviewed. No pertinent family history.  Social History Social History  Substance Use Topics  . Smoking status: Current Some Day Smoker    Packs/day: 0.50    Years: 15.00    Types: Cigarettes  . Smokeless tobacco: Never Used  . Alcohol use Yes      Comment: social     Allergies   Patient has no known allergies.   Review of Systems Review of Systems  Constitutional: Positive for chills, diaphoresis and fever.  HENT: Positive for congestion, ear pain, rhinorrhea and sore throat.   Respiratory: Positive for shortness of breath. Negative for wheezing.   Gastrointestinal: Positive for diarrhea (resolved) and nausea. Negative for abdominal pain and vomiting.  Musculoskeletal: Positive for myalgias (generalized).  Neurological: Positive for headaches.     Physical Exam Updated Vital Signs BP 136/91 (BP Location: Left Arm)   Pulse 93   Temp 98.2 F (36.8 C) (Oral)   Resp 18   Ht 5' 3.5" (1.613 m)   Wt 156 lb (70.8 kg)   SpO2 98%   BMI 27.20 kg/m   Physical Exam  Constitutional: She is oriented to person, place, and time. She appears well-developed and well-nourished. No distress.  HENT:  Head: Normocephalic and atraumatic.  Right Ear: Hearing, tympanic membrane, external ear and ear canal normal.  Left Ear: Hearing, tympanic membrane, external ear and ear canal normal.  Nose: Mucosal edema and rhinorrhea present.  Mouth/Throat: Uvula is midline, oropharynx is clear and moist and mucous membranes are normal.  Eyes: Conjunctivae are normal.  Cardiovascular: Normal rate and regular rhythm.  Exam reveals no gallop and no friction rub.   No murmur heard. Pulmonary/Chest: Effort normal and breath sounds normal. No respiratory distress. She has no wheezes. She has no rales. She exhibits no tenderness.  Abdominal: She exhibits no distension.  Musculoskeletal: Normal range of motion.  Neurological: She is alert and oriented to person, place, and time.  Skin: Skin is warm and dry.  Psychiatric: She has a normal mood and affect.  Nursing note and vitals reviewed.    ED Treatments / Results  DIAGNOSTIC STUDIES:  Oxygen Saturation is 98% on RA, normal by my interpretation.    COORDINATION OF CARE:  11:48 AM Discussed  treatment plan with pt at bedside including Ibuprofen, Theraflu, increased fluid intake, and her breathing inhaler and pt agreed to plan.  Labs (all labs ordered are listed, but only abnormal results are displayed) Labs Reviewed - No data to display  EKG  EKG Interpretation None       Radiology No results found.  Procedures Procedures (including critical care time)  Medications Ordered in ED Medications - No data to display   Initial Impression / Assessment and Plan / ED Course  I have reviewed the triage vital signs and the nursing notes.  Pertinent labs & imaging results that were available during my care of the patient were reviewed by me and considered in my medical decision making (see chart for details).  Patient with symptoms consistent with influenza.  Vitals are normal.  No signs of dehydration, tolerating PO's.  Lungs are clear. Due to patient's presentation and physical exam a chest x-ray was  not ordered bc likely diagnosis of flu and she is not significantly SOB.  Discussed the cost versus benefit of Tamiflu treatment with the patient.  She declines treatment. Patient will be discharged with instructions to orally hydrate, rest, and use over-the-counter medications such as anti-inflammatories ibuprofen and Aleve for muscle aches and Tylenol for fever. Work note given.   Final Clinical Impressions(s) / ED Diagnoses   Final diagnoses:  Flu-like symptoms    New Prescriptions Discharge Medication List as of 03/03/2016 11:55 AM     I personally performed the services described in this documentation, which was scribed in my presence. The recorded information has been reviewed and is accurate.     Bethel Born, PA-C 03/03/16 1319    Linwood Dibbles, MD 03/03/16 [id number]

## 2016-03-03 NOTE — ED Triage Notes (Signed)
Patient c/o productive cough with yellow/green sputum, headache, fever, right ear pain, and body aches x 3 days.

## 2016-03-04 ENCOUNTER — Ambulatory Visit: Payer: 59

## 2016-03-08 MED FILL — IBUPROFEN 800 MG TABLET: 800 | 5 days supply | Qty: 20 | Fill #0

## 2016-03-08 MED FILL — AMOXICILLIN 875 MG TABLET: 875 | 8 days supply | Qty: 16 | Fill #0

## 2016-04-12 MED FILL — MONTELUKAST SOD 10 MG TAB: 10 | 90 days supply | Qty: 90 | Fill #0

## 2016-09-14 MED FILL — MONTELUKAST SOD 10 MG TAB: 10 | 90 days supply | Qty: 90 | Fill #0

## 2016-12-15 MED FILL — MONTELUKAST SOD 10 MG TAB: 10 | 90 days supply | Qty: 90 | Fill #1

## 2018-05-16 ENCOUNTER — Other Ambulatory Visit: Payer: Self-pay

## 2018-05-16 ENCOUNTER — Encounter (HOSPITAL_COMMUNITY): Payer: Self-pay

## 2018-05-16 ENCOUNTER — Ambulatory Visit (HOSPITAL_COMMUNITY)
Admission: EM | Admit: 2018-05-16 | Discharge: 2018-05-16 | Disposition: A | Discharge status: Home / Self Care | Payer: Self-pay | Attending: Family Medicine | Admitting: Family Medicine

## 2018-05-16 DIAGNOSIS — J4521 Mild intermittent asthma with (acute) exacerbation: Secondary | ICD-10-CM

## 2018-05-16 MED ORDER — ALBUTEROL SULFATE HFA 108 (90 BASE) MCG/ACT IN AERS: 1.0000 | INHALATION_SPRAY | Freq: Four times a day (QID) | RESPIRATORY_TRACT | 0 refills | Status: AC | PRN

## 2018-05-16 MED ORDER — FLUTICASONE PROPIONATE 50 MCG/ACT NA SUSP: 1.0000 | Freq: Every day | NASAL | 0 refills | Status: AC

## 2018-05-16 MED ORDER — MONTELUKAST SODIUM 10 MG PO TABS: 10.0000 mg | ORAL_TABLET | Freq: Every day | ORAL | 0 refills | Status: AC

## 2018-05-16 MED ORDER — PREDNISONE 50 MG PO TABS: 50.0000 mg | ORAL_TABLET | Freq: Every day | ORAL | 0 refills | Status: AC

## 2018-05-16 NOTE — ED Triage Notes (Signed)
Pt states her asthma flaring up for a month. Pt states her allergies are flaring up. This has been going on for a month.

## 2018-05-16 NOTE — Discharge Instructions (Addendum)
Please begin prednisone daily for the next 5 days, please take with food and in the morning if you are able Continue to use albuterol inhaler as needed for shortness of breath and wheezing Singulair daily at bedtime-I sent in 3 months of this Continue Allegra-D May also use Flonase to further help with nasal congestion  Please follow-up if developing worsening shortness of breath, difficulty breathing, fevers, chills, worsening cough

## 2018-05-16 NOTE — ED Provider Notes (Signed)
[facility]    CSN: [id number] Arrival date & time: 05/16/18  0801     History   Chief Complaint Chief Complaint  Patient presents with  . Shortness of Breath    HPI Tabitha Winters is a 53 y.o. female history of asthma and allergic rhinitis presenting today for evaluation of asthma exacerbation and worsening allergies.  Patient states that over the past months she has had nasal congestion as well as worsening chest tightness and shortness of breath.  More recently in the morning she has had coughing spells for approximately 1 hour, will improve when she uses her albuterol inhaler.  Notes that she previously was on Singulair which would control her allergies and prevent frequent asthma exacerbations.  She has been off of this for approximately 1 year she has been working as a traveling Psychologist, sport and exercise.  She denies any fevers, chills.  She has had some mild abdominal discomfort with coughing, but denies diffuse body aches.  Denies sore throat.  Denies any known exposures to COVID or close contacts that have been sick.  HPI  History reviewed. No pertinent past medical history.  There are no active problems to display for this patient.   History reviewed. No pertinent surgical history.  OB History   No obstetric history on file.      Home Medications    Prior to Admission medications   Medication Sig Start Date End Date Taking? Authorizing Provider  albuterol (VENTOLIN HFA) 108 (90 Base) MCG/ACT inhaler Inhale 1-2 puffs into the lungs every 6 (six) hours as needed for wheezing or shortness of breath. 05/16/18   Wieters, Hallie C, PA-C  Aspirin-Acetaminophen-Caffeine (GOODY HEADACHE PO) Take 1 each by mouth daily as needed (headache.).    [provider]  fluticasone (FLONASE) 50 MCG/ACT nasal spray Place 1-2 sprays into both nostrils daily for 7 days. 05/16/18 05/23/18  Wieters, Hallie C, PA-C  ibuprofen (ADVIL,MOTRIN) 200 MG tablet Take 400 mg by mouth every 6 (six)  hours as needed for headache or moderate pain.    [provider]  loratadine (CLARITIN) 10 MG tablet Take 10 mg by mouth daily.    [provider]  montelukast (SINGULAIR) 10 MG tablet Take 1 tablet (10 mg total) by mouth at bedtime. 05/16/18   Wieters, Hallie C, PA-C  Olopatadine HCl (PATADAY OP) Apply 1 drop to eye daily.    [provider]  predniSONE (DELTASONE) 50 MG tablet Take 1 tablet (50 mg total) by mouth daily for 5 days. 05/16/18 05/21/18  Wieters, Junius Creamer, PA-C    Family History No family history on file.  Social History Social History   Tobacco Use  . Smoking status: Current Some Day Smoker    Packs/day: 0.50    Years: 15.00    Pack years: 7.50    Types: Cigarettes  . Smokeless tobacco: Never Used  Substance Use Topics  . Alcohol use: Yes    Comment: social  . Drug use: No     Allergies   Patient has no known allergies.   Review of Systems Review of Systems  Constitutional: Negative for activity change, appetite change, chills, fatigue and fever.  HENT: Positive for congestion and rhinorrhea. Negative for ear pain, sinus pressure, sore throat and trouble swallowing.   Eyes: Negative for discharge and redness.  Respiratory: Positive for cough and wheezing. Negative for chest tightness and shortness of breath.   Cardiovascular: Negative for chest pain.  Gastrointestinal: Negative for abdominal pain, diarrhea,  nausea and vomiting.  Musculoskeletal: Negative for myalgias.  Skin: Negative for rash.  Neurological: Negative for dizziness, light-headedness and headaches.     Physical Exam Triage Vital Signs ED Triage Vitals  Enc Vitals Group     BP 05/16/18 0814 (!) 140/95     Pulse Rate 05/16/18 0814 76     Resp 05/16/18 0814 16     Temp 05/16/18 0814 97.8 F (36.6 C)     Temp Source 05/16/18 0814 Oral     SpO2 05/16/18 0814 98 %     Weight 05/16/18 0816 160 lb (72.6 kg)     Height --      Head Circumference --      Peak Flow  --      Pain Score 05/16/18 0815 5     Pain Loc --      Pain Edu? --      Excl. in GC? --    No data found.  Updated Vital Signs BP (!) 140/95 (BP Location: Right Arm)   Pulse 76   Temp 97.8 F (36.6 C) (Oral)   Resp 16   Wt 160 lb (72.6 kg)   SpO2 98%   BMI 27.90 kg/m   Visual Acuity Right Eye Distance:   Left Eye Distance:   Bilateral Distance:    Right Eye Near:   Left Eye Near:    Bilateral Near:     Physical Exam Vitals signs and nursing note reviewed.  Constitutional:      General: She is not in acute distress.    Appearance: She is well-developed.  HENT:     Head: Normocephalic and atraumatic.     Ears:     Comments: Bilateral ears without tenderness to palpation of external auricle, tragus and mastoid, EAC's without erythema or swelling, TM's with good bony landmarks and cone of light. Non erythematous.     Nose:     Comments: Nasal mucosa pale and boggy, clear rhinorrhea present    Mouth/Throat:     Comments: Oral mucosa pink and moist, no tonsillar enlargement or exudate. Posterior pharynx patent and nonerythematous, no uvula deviation or swelling. Normal phonation.  Eyes:     Conjunctiva/sclera: Conjunctivae normal.  Neck:     Musculoskeletal: Neck supple.  Cardiovascular:     Rate and Rhythm: Normal rate and regular rhythm.     Heart sounds: No murmur.  Pulmonary:     Effort: Pulmonary effort is normal. No respiratory distress.     Breath sounds: Normal breath sounds.     Comments: Breathing comfortably at rest, CTABL, no wheezing, rales or other adventitious sounds auscultated Abdominal:     Palpations: Abdomen is soft.     Tenderness: There is no abdominal tenderness.  Skin:    General: Skin is warm and dry.  Neurological:     Mental Status: She is alert.      UC Treatments / Results  Labs (all labs ordered are listed, but only abnormal results are displayed) Labs Reviewed - No data to display  EKG None  Radiology No results  found.  Procedures Procedures (including critical care time)  Medications Ordered in UC Medications - No data to display  Initial Impression / Assessment and Plan / UC Course  I have reviewed the triage vital signs and the nursing notes.  Pertinent labs & imaging results that were available during my care of the patient were reviewed by me and considered in my medical decision making (see chart for  details).     Vital signs stable, no fever, tachycardia or hypoxia.  Lungs clear on auscultation.  Likely allergic rhinitis exacerbating asthma.  No systemic symptoms suggestive of COVID.  Will treat as asthma exacerbation with 5 days of prednisone, will refill albuterol.  Will restart on Singulair.  Continue Allegra-D for allergies, may use Flonase to supplement for further nasal congestion.  Provided contact for PCP.  Continue to monitor,Discussed strict return precautions. Patient verbalized understanding and is agreeable with plan.  Final Clinical Impressions(s) / UC Diagnoses   Final diagnoses:  Mild intermittent asthma with exacerbation     Discharge Instructions     Please begin prednisone daily for the next 5 days, please take with food and in the morning if you are able Continue to use albuterol inhaler as needed for shortness of breath and wheezing Singulair daily at bedtime-I sent in 3 months of this Continue Allegra-D May also use Flonase to further help with nasal congestion  Please follow-up if developing worsening shortness of breath, difficulty breathing, fevers, chills, worsening cough   ED Prescriptions    Medication Sig Dispense Auth. Provider   albuterol (VENTOLIN HFA) 108 (90 Base) MCG/ACT inhaler Inhale 1-2 puffs into the lungs every 6 (six) hours as needed for wheezing or shortness of breath. 1 Inhaler Wieters, Hallie C, PA-C   fluticasone (FLONASE) 50 MCG/ACT nasal spray Place 1-2 sprays into both nostrils daily for 7 days. 1 g Wieters, Hallie C, PA-C    montelukast (SINGULAIR) 10 MG tablet Take 1 tablet (10 mg total) by mouth at bedtime. 90 tablet Wieters, Hallie C, PA-C   predniSONE (DELTASONE) 50 MG tablet Take 1 tablet (50 mg total) by mouth daily for 5 days. 5 tablet Wieters, Hallie C, PA-C     Controlled Substance Prescriptions [place] Controlled Substance Registry consulted? Not Applicable   Lew Dawes, [place] 05/16/18 [phone]

## 2019-10-29 ENCOUNTER — Telehealth: Payer: Self-pay

## 2019-10-29 NOTE — Telephone Encounter (Signed)
Patient called left message requesting return call regarding scheduling a mammogram.  I contacted Tabitha Winters, she stated she can feel a lump in the left breast in the same area as the biopsy in 2016. Patient did not qualify for [facility] as she has [name]. Patient was informed that her visit to Dr. Liliana Cline in 04/2019 is in our Epic and to contact [facility], telephone number provided.

## 2020-05-21 ENCOUNTER — Other Ambulatory Visit (HOSPITAL_COMMUNITY): Payer: Self-pay

## 2020-05-21 MED ORDER — MONTELUKAST SODIUM 10 MG PO TABS
10.0000 mg | ORAL_TABLET | Freq: Every evening | ORAL | 2 refills | Status: AC
  Filled 2020-05-21: qty 30, 30d supply, fill #0

## 2020-05-29 ENCOUNTER — Other Ambulatory Visit (HOSPITAL_COMMUNITY): Payer: Self-pay

## 2023-08-22 ENCOUNTER — Other Ambulatory Visit: Payer: Self-pay | Admitting: Family Medicine

## 2023-08-22 DIAGNOSIS — Z87891 Personal history of nicotine dependence: Secondary | ICD-10-CM
